# Patient Record
Sex: Female | Born: 1985 | Smoking: Never smoker
Health system: Southern US, Community
[De-identification: ages and names within clinical notes are randomized; demographics above are authoritative.]

## PROBLEM LIST (undated history)

## (undated) DIAGNOSIS — F419 Anxiety disorder, unspecified: Secondary | ICD-10-CM

## (undated) DIAGNOSIS — J45909 Unspecified asthma, uncomplicated: Secondary | ICD-10-CM

## (undated) HISTORY — PX: WISDOM TOOTH EXTRACTION: SHX21

---

## 2014-10-17 DIAGNOSIS — J45909 Unspecified asthma, uncomplicated: Secondary | ICD-10-CM | POA: Insufficient documentation

## 2016-12-17 DIAGNOSIS — F419 Anxiety disorder, unspecified: Secondary | ICD-10-CM | POA: Insufficient documentation

## 2016-12-17 DIAGNOSIS — Z0000 Encounter for general adult medical examination without abnormal findings: Secondary | ICD-10-CM | POA: Insufficient documentation

## 2018-08-31 LAB — OB RESULTS CONSOLE ANTIBODY SCREEN: Antibody Screen: NEGATIVE

## 2018-08-31 LAB — OB RESULTS CONSOLE HEPATITIS B SURFACE ANTIGEN: Hepatitis B Surface Ag: NEGATIVE

## 2018-08-31 LAB — OB RESULTS CONSOLE RUBELLA ANTIBODY, IGM: Rubella: IMMUNE

## 2018-08-31 LAB — OB RESULTS CONSOLE HIV ANTIBODY (ROUTINE TESTING): HIV: NONREACTIVE

## 2018-08-31 LAB — OB RESULTS CONSOLE ABO/RH: RH Type: POSITIVE

## 2018-08-31 LAB — OB RESULTS CONSOLE RPR: RPR: NONREACTIVE

## 2018-09-28 ENCOUNTER — Other Ambulatory Visit: Payer: Self-pay | Admitting: Obstetrics and Gynecology

## 2018-09-28 ENCOUNTER — Other Ambulatory Visit (HOSPITAL_COMMUNITY)
Admission: RE | Admit: 2018-09-28 | Discharge: 2018-09-28 | Disposition: A | Discharge status: Home / Self Care | Payer: Managed Care, Other (non HMO) | Source: Ambulatory Visit | Attending: Obstetrics and Gynecology | Admitting: Obstetrics and Gynecology

## 2018-09-28 DIAGNOSIS — Z3481 Encounter for supervision of other normal pregnancy, first trimester: Secondary | ICD-10-CM | POA: Insufficient documentation

## 2018-10-05 LAB — CYTOLOGY - PAP
Diagnosis: NEGATIVE
HPV: NOT DETECTED

## 2019-02-15 ENCOUNTER — Encounter (HOSPITAL_COMMUNITY): Payer: Self-pay | Admitting: *Deleted

## 2019-02-15 ENCOUNTER — Other Ambulatory Visit: Payer: Self-pay

## 2019-02-15 ENCOUNTER — Inpatient Hospital Stay (HOSPITAL_COMMUNITY)
Admission: AD | Admit: 2019-02-15 | Discharge: 2019-02-15 | Disposition: A | Discharge status: Home / Self Care | Payer: Managed Care, Other (non HMO) | Attending: Obstetrics and Gynecology | Admitting: Obstetrics and Gynecology

## 2019-02-15 DIAGNOSIS — O26893 Other specified pregnancy related conditions, third trimester: Secondary | ICD-10-CM | POA: Insufficient documentation

## 2019-02-15 DIAGNOSIS — M549 Dorsalgia, unspecified: Secondary | ICD-10-CM | POA: Diagnosis not present

## 2019-02-15 DIAGNOSIS — M545 Low back pain: Secondary | ICD-10-CM | POA: Insufficient documentation

## 2019-02-15 DIAGNOSIS — O9989 Other specified diseases and conditions complicating pregnancy, childbirth and the puerperium: Secondary | ICD-10-CM | POA: Diagnosis not present

## 2019-02-15 DIAGNOSIS — Z3A32 32 weeks gestation of pregnancy: Secondary | ICD-10-CM | POA: Diagnosis not present

## 2019-02-15 HISTORY — DX: Unspecified asthma, uncomplicated: J45.909

## 2019-02-15 LAB — URINALYSIS, ROUTINE W REFLEX MICROSCOPIC
Bilirubin Urine: NEGATIVE
Glucose, UA: NEGATIVE mg/dL
Ketones, ur: NEGATIVE mg/dL
Nitrite: NEGATIVE
Protein, ur: NEGATIVE mg/dL
Specific Gravity, Urine: 1.008 (ref 1.005–1.030)
pH: 7 (ref 5.0–8.0)

## 2019-02-15 MED ORDER — ACETAMINOPHEN 500 MG PO TABS
1000.0000 mg | ORAL_TABLET | Freq: Once | ORAL | Status: AC
  Administered 2019-02-15: 1000 mg via ORAL
  Filled 2019-02-15: qty 2

## 2019-02-15 NOTE — Discharge Instructions (Signed)
Back Pain in Pregnancy  Back pain during pregnancy is common. Back pain may be caused by several factors that are related to changes during your pregnancy.  Follow these instructions at home:  Managing pain, stiffness, and swelling          If directed, for sudden (acute) back pain, put ice on the painful area.  ? Put ice in a plastic bag.  ? Place a towel between your skin and the bag.  ? Leave the ice on for 20 minutes, 2-3 times per day.   If directed, apply heat to the affected area before you exercise. Use the heat source that your health care provider recommends, such as a moist heat pack or a heating pad.  ? Place a towel between your skin and the heat source.  ? Leave the heat on for 20-30 minutes.  ? Remove the heat if your skin turns bright red. This is especially important if you are unable to feel pain, heat, or cold. You may have a greater risk of getting burned.   If directed, massage the affected area.  Activity   Exercise as told by your health care provider. Gentle exercise is the best way to prevent or manage back pain.   Listen to your body when lifting. If lifting hurts, ask for help or bend your knees. This uses your leg muscles instead of your back muscles.   Squat down when picking up something from the floor. Do not bend over.   Only use bed rest for short periods as told by your health care provider. Bed rest should only be used for the most severe episodes of back pain.  Standing, sitting, and lying down   Do not stand in one place for long periods of time.   Use good posture when sitting. Make sure your head rests over your shoulders and is not hanging forward. Use a pillow on your lower back if necessary.   Try sleeping on your side, preferably the left side, with a pregnancy support pillow or 1-2 regular pillows between your legs.  ? If you have back pain after a night's rest, your bed may be too soft.  ? A firm mattress may provide more support for your back during  pregnancy.  General instructions   Do not wear high heels.   Eat a healthy diet. Try to gain weight within your health care provider's recommendations.   Use a maternity girdle, elastic sling, or back brace as told by your health care provider.   Take over-the-counter and prescription medicines only as told by your health care provider.   Work with a physical therapist or massage therapist to find ways to manage back pain. Acupuncture or massage therapy may be helpful.   Keep all follow-up visits as told by your health care provider. This is important.  Contact a health care provider if:   Your back pain interferes with your daily activities.   You have increasing pain in other parts of your body.  Get help right away if:   You develop numbness, tingling, weakness, or problems with the use of your arms or legs.   You develop severe back pain that is not controlled with medicine.   You have a change in bowel or bladder control.   You develop shortness of breath, dizziness, or you faint.   You develop nausea, vomiting, or sweating.   You have back pain that is a rhythmic, cramping pain similar to labor pains. Labor   pain is usually 1-2 minutes apart, lasts for about 1 minute, and involves a bearing down feeling or pressure in your pelvis.   You have back pain and your water breaks or you have vaginal bleeding.   You have back pain or numbness that travels down your leg.   Your back pain developed after you fell.   You develop pain on one side of your back.   You see blood in your urine.   You develop skin blisters in the area of your back pain.  Summary   Back pain may be caused by several factors that are related to changes during your pregnancy.   Follow instructions as told by your health care provider for managing pain, stiffness, and swelling.   Exercise as told by your health care provider. Gentle exercise is the best way to prevent or manage back pain.   Take over-the-counter and  prescription medicines only as told by your health care provider.   Keep all follow-up visits as told by your health care provider. This is important.  This information is not intended to replace advice given to you by your health care provider. Make sure you discuss any questions you have with your health care provider.  Document Released: 12/08/2005 Document Revised: 02/15/2018 Document Reviewed: 02/15/2018  Elsevier Interactive Patient Education  2019 [employer]

## 2019-02-15 NOTE — MAU Note (Signed)
Pt reports she has had lower back apin and leg pain and tightening of her tummy. Braxton hicks 3 times an hour. Good fetal movement. Took tylenol for the back pain helped a little. Went to [employer] and tightening and lower back pain started up again

## 2019-02-15 NOTE — MAU Provider Note (Signed)
History     CSN: [id number]  Arrival date and time: 02/15/19 1617   First Provider Initiated Contact with Patient 02/15/19 1717      Chief Complaint  Patient presents with  . Back Pain   HPI Ms. Bing NeighborsMelanie Klindt is a 33 y.o. G2P0010 at 5066w1d who presents to MAU today with complaint of low back pain since yesterday and few BH contractions. The patient states that she took Tylenol this morning around 1100 and it helped some. She has no pain while resting, but 8/10 pain noted with ambulation. She does not have an abdominal support band. She also noted abdominal "tightening" 3 times yesterday and twice today. She denies any tightening while at rest. She denies vaginal bleeding, LOF, UTI symptoms, N/V, fever, SOB or cough. She reports normal fetal movement.   OB History    Gravida  2   Para  0   Term  0   Preterm  0   AB  1   Living  0     SAB  0   TAB  1   Ectopic  0   Multiple  0   Live Births  0           Past Medical History:  Diagnosis Date  . Asthma     Past Surgical History:  Procedure Laterality Date  . NO PAST SURGERIES      No family history on file.  Social History   Tobacco Use  . Smoking status: Never Smoker  Substance Use Topics  . Alcohol use: Not Currently  . Drug use: Never    Allergies: No Known Allergies  No medications prior to admission.    Review of Systems  Constitutional: Negative for fever.  HENT: Negative for congestion.   Respiratory: Negative for cough and shortness of breath.   Gastrointestinal: Positive for abdominal pain. Negative for constipation, diarrhea, nausea and vomiting.  Genitourinary: Positive for vaginal discharge. Negative for dysuria, frequency, urgency and vaginal bleeding.  Musculoskeletal: Positive for back pain.   Physical Exam   Blood pressure 117/64, pulse 83, temperature 98.3 F (36.8 C), resp. rate 18.  Physical Exam  Nursing note and vitals reviewed. Constitutional: She is oriented to  person, place, and time. She appears well-developed and well-nourished. No distress.  HENT:  Head: Normocephalic and atraumatic.  Cardiovascular: Normal rate.  Respiratory: Effort normal.  GI: Soft. She exhibits no distension and no mass. There is no abdominal tenderness. There is no rebound, no guarding and no CVA tenderness.  Musculoskeletal:     Lumbar back: She exhibits tenderness (mild diffuse). She exhibits normal range of motion, no swelling and no spasm.  Neurological: She is alert and oriented to person, place, and time.  Skin: Skin is warm and dry. No erythema.  Psychiatric: She has a normal mood and affect.     Results for orders placed or performed during the hospital encounter of 02/15/19 (from the past 24 hour(s))  Urinalysis, Routine w reflex microscopic     Status: Abnormal   Collection Time: 02/15/19  5:11 PM  Result Value Ref Range   Color, Urine YELLOW YELLOW   APPearance HAZY (A) CLEAR   Specific Gravity, Urine 1.008 1.005 - 1.030   pH 7.0 5.0 - 8.0   Glucose, UA NEGATIVE NEGATIVE mg/dL   Hgb urine dipstick SMALL (A) NEGATIVE   Bilirubin Urine NEGATIVE NEGATIVE   Ketones, ur NEGATIVE NEGATIVE mg/dL   Protein, ur NEGATIVE NEGATIVE mg/dL   Nitrite NEGATIVE  NEGATIVE   Leukocytes,Ua SMALL (A) NEGATIVE   RBC / HPF 6-10 0 - 5 RBC/hpf   WBC, UA 6-10 0 - 5 WBC/hpf   Bacteria, UA RARE (A) NONE SEEN   Squamous Epithelial / LPF 11-20 0 - 5    Fetal Monitoring: Baseline: 140 bpm Variability: moderate Accelerations: 10 x 10 Decelerations: none Contractions: none  MAU Course  Procedures None  MDM UA today without evidence of dehydration or infection 1 G Tylenol given in MAU  NST reassuring for GA and without contractions. No palpable contractions while patient is in MAU.   Assessment and Plan  A: SIUP at [redacted]w[redacted]d Back pain in pregnancy, third trimester   P: Discharge home Tylenol PRN for pain recommended Hydrotherapy and use of heat/ice and abdominal  binder discussed  Preterm labor precautions discussed Patient advised to follow-up with [facility] as scheduled for routine prenatal care or sooner PRN Patient may return to MAU as needed or if her condition were to change or worsen   Vonzella Nipple, PA-C 02/15/2019, 6:36 PM

## 2019-03-19 LAB — OB RESULTS CONSOLE GBS: GBS: POSITIVE

## 2019-04-03 NOTE — H&P (Deleted)
  The note originally documented on this encounter has been moved the the encounter in which it belongs.  

## 2019-04-03 NOTE — H&P (Signed)
Connie Morales is a 33 y.o. female presenting for  External version attempt due to breech presenataion.  Pt will be 39 0/7 weeks on admission. Pt reports contractions 1 per hour.  Good fetal movement. PNC with [facility] complicated. Lower back pain and above. Pt is also Varicella Non Immune and GBS+.  OB History    Gravida  2   Para  0   Term  0   Preterm  0   AB  1   Living  0     SAB  0   TAB  1   Ectopic  0   Multiple  0   Live Births  0          Past Medical History:  Diagnosis Date  . Asthma    Past Surgical History:  Procedure Laterality Date  . NO PAST SURGERIES     Family History: family history is not on file. Social History:  reports that she has never smoked. She does not have any smokeless tobacco history on file. She reports previous alcohol use. She reports that she does not use drugs.     Maternal Diabetes: No Genetic Screening: Normal Maternal Ultrasounds/Referrals: Normal Fetal Ultrasounds or other Referrals:  None Maternal Substance Abuse:  No Significant Maternal Medications:  None Significant Maternal Lab Results:  Group B Strep positive Other Comments:  Breech presentation  ROS Maternal Medical History:  Contractions: Perceived severity is moderate.    Fetal activity: Perceived fetal activity is normal.    Prenatal Complications - Diabetes: none.      There were no vitals taken for this visit. Maternal Exam:  Abdomen: Patient reports no abdominal tenderness. Estimated fetal weight is 03/28/19- 6 pound 1 oz +/-8 oz, .   Fetal presentation: breech  Introitus: Normal vulva. Pelvis: adequate for delivery.   Cervix: 1/60/-2,-3  Fetal Exam Fetal Monitor Review: Mode: hand-held doppler probe.   Baseline rate: 150.      Physical Exam  Constitutional: She is oriented to person, place, and time. She appears well-developed and well-nourished.  HENT:  Head: Normocephalic and atraumatic.  Eyes: EOM are normal.  Neck: Normal  range of motion.  Respiratory: Effort normal and breath sounds normal. No respiratory distress.  GI: There is no abdominal tenderness.  Genitourinary:    Vulva normal.   Musculoskeletal: Normal range of motion.        General: No tenderness.  Neurological: She is alert and oriented to person, place, and time.  Skin: Skin is warm and dry.  Psychiatric: She has a normal mood and affect.    Prenatal labs: ABO, Rh:   Antibody:   Rubella:   RPR:    HBsAg:    HIV:    GBS:     Assessment/Plan: IUP @ 39 0/7 weeks on admission. Breech presentation for external version.  If successful, proceed with IOL.  If unsuccessful, primary LTCS. SGA. GBS+ Varicella Non immune.   Thurnell Lose 04/03/2019, 9:16 PM

## 2019-04-04 ENCOUNTER — Encounter (HOSPITAL_COMMUNITY): Payer: Self-pay

## 2019-04-04 ENCOUNTER — Inpatient Hospital Stay (HOSPITAL_COMMUNITY): Payer: Managed Care, Other (non HMO) | Admitting: Anesthesiology

## 2019-04-04 ENCOUNTER — Inpatient Hospital Stay (HOSPITAL_COMMUNITY)
Admission: RE | Admit: 2019-04-04 | Payer: Managed Care, Other (non HMO) | Source: Home / Self Care | Admitting: Obstetrics and Gynecology

## 2019-04-04 ENCOUNTER — Inpatient Hospital Stay (HOSPITAL_COMMUNITY): Payer: Managed Care, Other (non HMO)

## 2019-04-04 ENCOUNTER — Encounter (HOSPITAL_COMMUNITY)
Admission: AD | Disposition: A | Discharge status: Home / Self Care | Payer: Self-pay | Source: Ambulatory Visit | Attending: Obstetrics and Gynecology

## 2019-04-04 ENCOUNTER — Other Ambulatory Visit: Payer: Self-pay

## 2019-04-04 ENCOUNTER — Inpatient Hospital Stay (HOSPITAL_COMMUNITY)
Admission: AD | Admit: 2019-04-04 | Discharge: 2019-04-07 | DRG: 788 | Disposition: A | Discharge status: Home / Self Care | Payer: Managed Care, Other (non HMO) | Attending: Obstetrics and Gynecology | Admitting: Obstetrics and Gynecology

## 2019-04-04 DIAGNOSIS — O365930 Maternal care for other known or suspected poor fetal growth, third trimester, not applicable or unspecified: Secondary | ICD-10-CM | POA: Diagnosis present

## 2019-04-04 DIAGNOSIS — Z98891 History of uterine scar from previous surgery: Secondary | ICD-10-CM

## 2019-04-04 DIAGNOSIS — Z3A39 39 weeks gestation of pregnancy: Secondary | ICD-10-CM | POA: Diagnosis not present

## 2019-04-04 DIAGNOSIS — Z1159 Encounter for screening for other viral diseases: Secondary | ICD-10-CM | POA: Diagnosis not present

## 2019-04-04 DIAGNOSIS — F419 Anxiety disorder, unspecified: Secondary | ICD-10-CM | POA: Diagnosis present

## 2019-04-04 DIAGNOSIS — O321XX0 Maternal care for breech presentation, not applicable or unspecified: Secondary | ICD-10-CM | POA: Diagnosis present

## 2019-04-04 DIAGNOSIS — O99344 Other mental disorders complicating childbirth: Secondary | ICD-10-CM | POA: Diagnosis present

## 2019-04-04 DIAGNOSIS — O99824 Streptococcus B carrier state complicating childbirth: Secondary | ICD-10-CM | POA: Diagnosis present

## 2019-04-04 DIAGNOSIS — O328XX0 Maternal care for other malpresentation of fetus, not applicable or unspecified: Principal | ICD-10-CM | POA: Diagnosis present

## 2019-04-04 HISTORY — DX: Anxiety disorder, unspecified: F41.9

## 2019-04-04 LAB — CBC
HCT: 37.7 % (ref 36.0–46.0)
Hemoglobin: 12.3 g/dL (ref 12.0–15.0)
MCH: 27.6 pg (ref 26.0–34.0)
MCHC: 32.6 g/dL (ref 30.0–36.0)
MCV: 84.7 fL (ref 80.0–100.0)
Platelets: 282 10*3/uL (ref 150–400)
RBC: 4.45 MIL/uL (ref 3.87–5.11)
RDW: 14.2 % (ref 11.5–15.5)
WBC: 10.1 10*3/uL (ref 4.0–10.5)
nRBC: 0 % (ref 0.0–0.2)

## 2019-04-04 LAB — TYPE AND SCREEN
ABO/RH(D): O POS
Antibody Screen: NEGATIVE

## 2019-04-04 LAB — SARS CORONAVIRUS 2 BY RT PCR (HOSPITAL ORDER, PERFORMED IN ~~LOC~~ HOSPITAL LAB): SARS Coronavirus 2: NEGATIVE

## 2019-04-04 SURGERY — Surgical Case
Anesthesia: Spinal

## 2019-04-04 MED ORDER — TERBUTALINE SULFATE 1 MG/ML IJ SOLN
0.2500 mg | Freq: Once | INTRAMUSCULAR | Status: AC
  Administered 2019-04-04: 0.25 mg via SUBCUTANEOUS

## 2019-04-04 MED ORDER — FLEET ENEMA 7-19 GM/118ML RE ENEM: 1.0000 | ENEMA | RECTAL | Status: DC | PRN

## 2019-04-04 MED ORDER — PROMETHAZINE HCL 25 MG/ML IJ SOLN
INTRAMUSCULAR | Status: AC
  Filled 2019-04-04: qty 1

## 2019-04-04 MED ORDER — DIPHENHYDRAMINE HCL 50 MG/ML IJ SOLN: 12.5000 mg | Freq: Four times a day (QID) | INTRAMUSCULAR | Status: DC | PRN

## 2019-04-04 MED ORDER — ACETAMINOPHEN 325 MG PO TABS: 650.0000 mg | ORAL_TABLET | ORAL | Status: DC | PRN

## 2019-04-04 MED ORDER — TETANUS-DIPHTH-ACELL PERTUSSIS 5-2.5-18.5 LF-MCG/0.5 IM SUSP: 0.5000 mL | Freq: Once | INTRAMUSCULAR | Status: DC

## 2019-04-04 MED ORDER — LACTATED RINGERS IV SOLN: INTRAVENOUS | Status: DC

## 2019-04-04 MED ORDER — METHYLERGONOVINE MALEATE 0.2 MG PO TABS: 0.2000 mg | ORAL_TABLET | ORAL | Status: DC | PRN

## 2019-04-04 MED ORDER — NALOXONE HCL 0.4 MG/ML IJ SOLN: 0.4000 mg | INTRAMUSCULAR | Status: DC | PRN

## 2019-04-04 MED ORDER — SIMETHICONE 80 MG PO CHEW: 80.0000 mg | CHEWABLE_TABLET | ORAL | Status: DC | PRN

## 2019-04-04 MED ORDER — FAMOTIDINE 20 MG PO TABS
20.0000 mg | ORAL_TABLET | Freq: Once | ORAL | Status: AC
  Administered 2019-04-04: 12:00:00 20 mg via ORAL

## 2019-04-04 MED ORDER — ONDANSETRON HCL 4 MG/2ML IJ SOLN: 4.0000 mg | Freq: Four times a day (QID) | INTRAMUSCULAR | Status: DC | PRN

## 2019-04-04 MED ORDER — CEFAZOLIN SODIUM-DEXTROSE 2-4 GM/100ML-% IV SOLN
2.0000 g | INTRAVENOUS | Status: AC
  Administered 2019-04-04: 2 g via INTRAVENOUS

## 2019-04-04 MED ORDER — SODIUM CHLORIDE 0.9% FLUSH: 3.0000 mL | INTRAVENOUS | Status: DC | PRN

## 2019-04-04 MED ORDER — FAMOTIDINE 20 MG PO TABS
ORAL_TABLET | ORAL | Status: AC
  Filled 2019-04-04: qty 1

## 2019-04-04 MED ORDER — ONDANSETRON HCL 4 MG/2ML IJ SOLN
4.0000 mg | Freq: Three times a day (TID) | INTRAMUSCULAR | Status: DC | PRN
  Administered 2019-04-04: 4 mg via INTRAVENOUS
  Filled 2019-04-04 (×2): qty 2

## 2019-04-04 MED ORDER — PROMETHAZINE HCL 25 MG/ML IJ SOLN: 12.5000 mg | Freq: Four times a day (QID) | INTRAMUSCULAR | Status: DC | PRN

## 2019-04-04 MED ORDER — SIMETHICONE 80 MG PO CHEW
80.0000 mg | CHEWABLE_TABLET | Freq: Three times a day (TID) | ORAL | Status: DC
  Administered 2019-04-05 – 2019-04-07 (×8): 80 mg via ORAL
  Filled 2019-04-04 (×8): qty 1

## 2019-04-04 MED ORDER — OXYCODONE-ACETAMINOPHEN 5-325 MG PO TABS: 1.0000 | ORAL_TABLET | ORAL | Status: DC | PRN

## 2019-04-04 MED ORDER — HYDROXYZINE HCL 50 MG PO TABS
50.0000 mg | ORAL_TABLET | Freq: Four times a day (QID) | ORAL | Status: DC | PRN
  Filled 2019-04-04: qty 1

## 2019-04-04 MED ORDER — DIPHENHYDRAMINE HCL 25 MG PO CAPS
25.0000 mg | ORAL_CAPSULE | Freq: Four times a day (QID) | ORAL | Status: DC | PRN
  Administered 2019-04-05: 25 mg via ORAL
  Filled 2019-04-04: qty 1

## 2019-04-04 MED ORDER — SODIUM CHLORIDE 0.9 % IV SOLN
INTRAVENOUS | Status: DC | PRN
  Administered 2019-04-04: 40 [IU] via INTRAVENOUS

## 2019-04-04 MED ORDER — SOD CITRATE-CITRIC ACID 500-334 MG/5ML PO SOLN
ORAL | Status: AC
  Filled 2019-04-04: qty 15

## 2019-04-04 MED ORDER — NALBUPHINE HCL 10 MG/ML IJ SOLN: 5.0000 mg | INTRAMUSCULAR | Status: DC | PRN

## 2019-04-04 MED ORDER — PRENATAL MULTIVITAMIN CH
1.0000 | ORAL_TABLET | Freq: Every day | ORAL | Status: DC
  Administered 2019-04-05 – 2019-04-07 (×3): 1 via ORAL
  Filled 2019-04-04 (×3): qty 1

## 2019-04-04 MED ORDER — HYDROMORPHONE HCL 1 MG/ML IJ SOLN
INTRAMUSCULAR | Status: AC
  Filled 2019-04-04: qty 1

## 2019-04-04 MED ORDER — SODIUM CHLORIDE 0.9 % IV SOLN
INTRAVENOUS | Status: DC | PRN
  Administered 2019-04-04: 12:00:00 via INTRAVENOUS

## 2019-04-04 MED ORDER — ENOXAPARIN SODIUM 40 MG/0.4ML ~~LOC~~ SOLN
40.0000 mg | SUBCUTANEOUS | Status: DC
  Administered 2019-04-05 – 2019-04-07 (×3): 40 mg via SUBCUTANEOUS
  Filled 2019-04-04 (×3): qty 0.4

## 2019-04-04 MED ORDER — HYDROMORPHONE HCL 1 MG/ML IJ SOLN
0.2500 mg | INTRAMUSCULAR | Status: DC | PRN
  Administered 2019-04-04 (×2): 0.5 mg via INTRAVENOUS

## 2019-04-04 MED ORDER — SODIUM CHLORIDE 0.9 % IR SOLN
Status: DC | PRN
  Administered 2019-04-04: 1

## 2019-04-04 MED ORDER — OXYTOCIN 40 UNITS IN NORMAL SALINE INFUSION - SIMPLE MED: 2.5000 [IU]/h | INTRAVENOUS | Status: AC

## 2019-04-04 MED ORDER — WITCH HAZEL-GLYCERIN EX PADS: 1.0000 "application " | MEDICATED_PAD | CUTANEOUS | Status: DC | PRN

## 2019-04-04 MED ORDER — ACETAMINOPHEN 500 MG PO TABS
1000.0000 mg | ORAL_TABLET | Freq: Four times a day (QID) | ORAL | Status: DC
  Administered 2019-04-04 – 2019-04-07 (×11): 1000 mg via ORAL
  Filled 2019-04-04 (×12): qty 2

## 2019-04-04 MED ORDER — BUTORPHANOL TARTRATE 1 MG/ML IJ SOLN: 1.0000 mg | INTRAMUSCULAR | Status: DC | PRN

## 2019-04-04 MED ORDER — OXYTOCIN BOLUS FROM INFUSION: 500.0000 mL | Freq: Once | INTRAVENOUS | Status: DC

## 2019-04-04 MED ORDER — SIMETHICONE 80 MG PO CHEW
80.0000 mg | CHEWABLE_TABLET | ORAL | Status: DC
  Administered 2019-04-04 – 2019-04-07 (×3): 80 mg via ORAL
  Filled 2019-04-04 (×3): qty 1

## 2019-04-04 MED ORDER — DIPHENHYDRAMINE HCL 25 MG PO CAPS: 25.0000 mg | ORAL_CAPSULE | ORAL | Status: DC | PRN

## 2019-04-04 MED ORDER — PROMETHAZINE HCL 25 MG/ML IJ SOLN: 6.2500 mg | INTRAMUSCULAR | Status: DC | PRN

## 2019-04-04 MED ORDER — MENTHOL 3 MG MT LOZG: 1.0000 | LOZENGE | OROMUCOSAL | Status: DC | PRN

## 2019-04-04 MED ORDER — SENNOSIDES-DOCUSATE SODIUM 8.6-50 MG PO TABS
2.0000 | ORAL_TABLET | ORAL | Status: DC
  Administered 2019-04-04 – 2019-04-07 (×3): 2 via ORAL
  Filled 2019-04-04 (×4): qty 2

## 2019-04-04 MED ORDER — FAMOTIDINE 20 MG PO TABS
20.0000 mg | ORAL_TABLET | Freq: Two times a day (BID) | ORAL | Status: DC | PRN
  Administered 2019-04-05 – 2019-04-06 (×2): 20 mg via ORAL
  Filled 2019-04-04 (×2): qty 1

## 2019-04-04 MED ORDER — LACTATED RINGERS IV SOLN: 500.0000 mL | INTRAVENOUS | Status: DC | PRN

## 2019-04-04 MED ORDER — SCOPOLAMINE 1 MG/3DAYS TD PT72
1.0000 | MEDICATED_PATCH | Freq: Once | TRANSDERMAL | Status: AC
  Administered 2019-04-04: 17:00:00 1.5 mg via TRANSDERMAL
  Filled 2019-04-04: qty 1

## 2019-04-04 MED ORDER — KETOROLAC TROMETHAMINE 30 MG/ML IJ SOLN: 30.0000 mg | Freq: Once | INTRAMUSCULAR | Status: DC

## 2019-04-04 MED ORDER — ZOLPIDEM TARTRATE 5 MG PO TABS: 5.0000 mg | ORAL_TABLET | Freq: Every evening | ORAL | Status: DC | PRN

## 2019-04-04 MED ORDER — OXYCODONE HCL 5 MG PO TABS: 5.0000 mg | ORAL_TABLET | Freq: Once | ORAL | Status: DC | PRN

## 2019-04-04 MED ORDER — LACTATED RINGERS IV SOLN
INTRAVENOUS | Status: DC
  Administered 2019-04-04: 23:00:00 via INTRAVENOUS

## 2019-04-04 MED ORDER — NALBUPHINE HCL 10 MG/ML IJ SOLN: 5.0000 mg | Freq: Once | INTRAMUSCULAR | Status: DC | PRN

## 2019-04-04 MED ORDER — LACTATED RINGERS IV BOLUS
500.0000 mL | Freq: Once | INTRAVENOUS | Status: AC
  Administered 2019-04-04: 500 mL via INTRAVENOUS

## 2019-04-04 MED ORDER — FENTANYL CITRATE (PF) 100 MCG/2ML IJ SOLN
INTRAMUSCULAR | Status: DC | PRN
  Administered 2019-04-04: 15 ug via INTRATHECAL

## 2019-04-04 MED ORDER — CEFAZOLIN SODIUM-DEXTROSE 2-4 GM/100ML-% IV SOLN
INTRAVENOUS | Status: AC
  Filled 2019-04-04: qty 100

## 2019-04-04 MED ORDER — ONDANSETRON HCL 4 MG/2ML IJ SOLN
INTRAMUSCULAR | Status: DC | PRN
  Administered 2019-04-04: 4 mg via INTRAVENOUS

## 2019-04-04 MED ORDER — SOD CITRATE-CITRIC ACID 500-334 MG/5ML PO SOLN
30.0000 mL | ORAL | Status: DC | PRN
  Administered 2019-04-04: 12:00:00 30 mL via ORAL

## 2019-04-04 MED ORDER — SOD CITRATE-CITRIC ACID 500-334 MG/5ML PO SOLN: 30.0000 mL | Freq: Once | ORAL | Status: DC

## 2019-04-04 MED ORDER — NALOXONE HCL 4 MG/10ML IJ SOLN
1.0000 ug/kg/h | INTRAVENOUS | Status: DC | PRN
  Filled 2019-04-04: qty 5

## 2019-04-04 MED ORDER — BUPIVACAINE IN DEXTROSE 0.75-8.25 % IT SOLN
INTRATHECAL | Status: DC | PRN
  Administered 2019-04-04: 1.6 mL via INTRATHECAL

## 2019-04-04 MED ORDER — OXYCODONE HCL 5 MG PO TABS
5.0000 mg | ORAL_TABLET | ORAL | Status: DC | PRN
  Administered 2019-04-05 – 2019-04-06 (×7): 10 mg via ORAL
  Administered 2019-04-07: 5 mg via ORAL
  Administered 2019-04-07: 13:00:00 10 mg via ORAL
  Filled 2019-04-04 (×6): qty 2
  Filled 2019-04-04: qty 1
  Filled 2019-04-04 (×2): qty 2

## 2019-04-04 MED ORDER — OXYTOCIN 40 UNITS IN NORMAL SALINE INFUSION - SIMPLE MED: 2.5000 [IU]/h | INTRAVENOUS | Status: DC

## 2019-04-04 MED ORDER — MORPHINE SULFATE (PF) 0.5 MG/ML IJ SOLN
INTRAMUSCULAR | Status: DC | PRN
  Administered 2019-04-04: .15 mg via INTRATHECAL

## 2019-04-04 MED ORDER — IBUPROFEN 600 MG PO TABS
600.0000 mg | ORAL_TABLET | Freq: Four times a day (QID) | ORAL | Status: DC | PRN
  Administered 2019-04-05 – 2019-04-07 (×9): 600 mg via ORAL
  Filled 2019-04-04 (×9): qty 1

## 2019-04-04 MED ORDER — METHYLERGONOVINE MALEATE 0.2 MG/ML IJ SOLN: 0.2000 mg | INTRAMUSCULAR | Status: DC | PRN

## 2019-04-04 MED ORDER — TERBUTALINE SULFATE 1 MG/ML IJ SOLN
INTRAMUSCULAR | Status: AC
  Filled 2019-04-04: qty 1

## 2019-04-04 MED ORDER — LACTATED RINGERS IV BOLUS
1000.0000 mL | Freq: Once | INTRAVENOUS | Status: AC
  Administered 2019-04-04: 1000 mL via INTRAVENOUS

## 2019-04-04 MED ORDER — ALBUTEROL SULFATE (2.5 MG/3ML) 0.083% IN NEBU: 2.5000 mg | INHALATION_SOLUTION | Freq: Four times a day (QID) | RESPIRATORY_TRACT | Status: DC | PRN

## 2019-04-04 MED ORDER — COCONUT OIL OIL: 1.0000 "application " | TOPICAL_OIL | Status: DC | PRN

## 2019-04-04 MED ORDER — SODIUM CHLORIDE 0.9 % IV SOLN
INTRAVENOUS | Status: DC | PRN
  Administered 2019-04-04: 60 ug/min via INTRAVENOUS

## 2019-04-04 MED ORDER — LACTATED RINGERS IV SOLN
INTRAVENOUS | Status: DC
  Administered 2019-04-04: 10:00:00 via INTRAVENOUS

## 2019-04-04 MED ORDER — KETOROLAC TROMETHAMINE 30 MG/ML IJ SOLN
INTRAMUSCULAR | Status: AC
  Filled 2019-04-04: qty 1

## 2019-04-04 MED ORDER — KETOROLAC TROMETHAMINE 30 MG/ML IJ SOLN
30.0000 mg | Freq: Four times a day (QID) | INTRAMUSCULAR | Status: DC | PRN
  Administered 2019-04-04: 30 mg via INTRAVENOUS
  Filled 2019-04-04: qty 1

## 2019-04-04 MED ORDER — DIBUCAINE (PERIANAL) 1 % EX OINT: 1.0000 "application " | TOPICAL_OINTMENT | CUTANEOUS | Status: DC | PRN

## 2019-04-04 MED ORDER — LIDOCAINE HCL (PF) 1 % IJ SOLN
30.0000 mL | INTRAMUSCULAR | Status: DC | PRN
  Filled 2019-04-04: qty 30

## 2019-04-04 MED ORDER — FAMOTIDINE IN NACL 20-0.9 MG/50ML-% IV SOLN
20.0000 mg | Freq: Once | INTRAVENOUS | Status: DC
  Filled 2019-04-04: qty 50

## 2019-04-04 MED ORDER — OXYCODONE-ACETAMINOPHEN 5-325 MG PO TABS: 2.0000 | ORAL_TABLET | ORAL | Status: DC | PRN

## 2019-04-04 MED ORDER — OXYCODONE HCL 5 MG/5ML PO SOLN: 5.0000 mg | Freq: Once | ORAL | Status: DC | PRN

## 2019-04-04 SURGICAL SUPPLY — 36 items
BARRIER ADHS 3X4 INTERCEED (GAUZE/BANDAGES/DRESSINGS) ×3 IMPLANT
BENZOIN TINCTURE PRP APPL 2/3 (GAUZE/BANDAGES/DRESSINGS) ×2 IMPLANT
CHLORAPREP W/TINT 26ML (MISCELLANEOUS) ×3 IMPLANT
CLAMP CORD UMBIL (MISCELLANEOUS) IMPLANT
CLOSURE WOUND 1/2 X4 (GAUZE/BANDAGES/DRESSINGS) ×1
CLOTH BEACON ORANGE TIMEOUT ST (SAFETY) ×3 IMPLANT
DERMABOND ADVANCED (GAUZE/BANDAGES/DRESSINGS)
DERMABOND ADVANCED .7 DNX12 (GAUZE/BANDAGES/DRESSINGS) IMPLANT
DRSG OPSITE POSTOP 4X10 (GAUZE/BANDAGES/DRESSINGS) ×3 IMPLANT
ELECT REM PT RETURN 9FT ADLT (ELECTROSURGICAL) ×3
ELECTRODE REM PT RTRN 9FT ADLT (ELECTROSURGICAL) ×1 IMPLANT
EXTRACTOR VACUUM BELL STYLE (SUCTIONS) IMPLANT
GLOVE BIO SURGEON STRL SZ7 (GLOVE) ×3 IMPLANT
GLOVE BIOGEL PI IND STRL 7.0 (GLOVE) ×2 IMPLANT
GLOVE BIOGEL PI INDICATOR 7.0 (GLOVE) ×4
GOWN STRL REUS W/TWL LRG LVL3 (GOWN DISPOSABLE) ×6 IMPLANT
KIT ABG SYR 3ML LUER SLIP (SYRINGE) IMPLANT
NDL HYPO 25X5/8 SAFETYGLIDE (NEEDLE) IMPLANT
NEEDLE HYPO 25X5/8 SAFETYGLIDE (NEEDLE) IMPLANT
NS IRRIG 1000ML POUR BTL (IV SOLUTION) ×3 IMPLANT
PACK C SECTION WH (CUSTOM PROCEDURE TRAY) ×3 IMPLANT
PAD OB MATERNITY 4.3X12.25 (PERSONAL CARE ITEMS) ×3 IMPLANT
PENCIL SMOKE EVAC W/HOLSTER (ELECTROSURGICAL) ×3 IMPLANT
RTRCTR C-SECT PINK 25CM LRG (MISCELLANEOUS) ×3 IMPLANT
STRIP CLOSURE SKIN 1/2X4 (GAUZE/BANDAGES/DRESSINGS) ×1 IMPLANT
SUT CHROMIC 0 CTX 36 (SUTURE) IMPLANT
SUT MON AB 4-0 PS1 27 (SUTURE) ×3 IMPLANT
SUT PLAIN 0 NONE (SUTURE) IMPLANT
SUT PLAIN 2 0 XLH (SUTURE) ×3 IMPLANT
SUT VIC AB 0 CTX 36 (SUTURE) ×10
SUT VIC AB 0 CTX36XBRD ANBCTRL (SUTURE) ×5 IMPLANT
SUT VIC AB 2-0 CT1 27 (SUTURE) ×2
SUT VIC AB 2-0 CT1 TAPERPNT 27 (SUTURE) ×1 IMPLANT
TOWEL OR 17X24 6PK STRL BLUE (TOWEL DISPOSABLE) ×3 IMPLANT
TRAY FOLEY W/BAG SLVR 14FR LF (SET/KITS/TRAYS/PACK) IMPLANT
WATER STERILE IRR 1000ML POUR (IV SOLUTION) ×3 IMPLANT

## 2019-04-04 NOTE — Anesthesia Procedure Notes (Signed)
Spinal  Patient location during procedure: OR Start time: 04/04/2019 11:40 AM End time: 04/04/2019 11:50 AM Staffing Anesthesiologist: Murvin Natal, MD Performed: anesthesiologist  Preanesthetic Checklist Completed: patient identified, surgical consent, pre-op evaluation, timeout performed, IV checked, risks and benefits discussed and monitors and equipment checked Spinal Block Patient position: sitting Prep: DuraPrep Patient monitoring: cardiac monitor, continuous pulse ox and blood pressure Approach: midline Location: L4-5 Injection technique: single-shot Needle Needle type: Pencan  Needle gauge: 24 G Needle length: 9 cm Assessment Sensory level: T10 Additional Notes Functioning IV was confirmed and monitors were applied. Sterile prep and drape, including hand hygiene and sterile gloves were used. The patient was positioned and the spine was prepped. The skin was anesthetized with lidocaine.  Free flow of clear CSF was obtained prior to injecting local anesthetic into the CSF.  The spinal needle aspirated freely following injection.  The needle was carefully withdrawn.  The patient tolerated the procedure well.

## 2019-04-04 NOTE — MAU Note (Signed)
Swab collected without difficulty. 

## 2019-04-04 NOTE — Brief Op Note (Signed)
04/04/2019  12:52 PM  PATIENT:  Connie Morales  33 y.o. female  PRE-OPERATIVE DIAGNOSIS:  IUP @ 39 0/7 weeks, O32.1XX0 Breech presentation, single fetus, failed external version  POST-OPERATIVE DIAGNOSIS:    PROCEDURE:  Procedure(s): CESAREAN SECTION (N/A), Primary LTCS  SURGEON:  Surgeon(s) and Role:    Thurnell Lose, MD - Primary  PHYSICIAN ASSISTANT:   ASSISTANTS: Technician   ANESTHESIA:   spinal  EBL:  99 mL   BLOOD ADMINISTERED:none  DRAINS: Urinary Catheter (Foley)   LOCAL MEDICATIONS USED:  NONE  SPECIMEN:  No Specimen  DISPOSITION OF SPECIMEN:  N/A  COUNTS:  YES  TOURNIQUET:  * No tourniquets in log *  DICTATION: .Other Dictation: Dictation Number [id number]  PLAN OF CARE: Admit to inpatient   PATIENT DISPOSITION:  PACU - hemodynamically stable.   Delay start of Pharmacological VTE agent (>24hrs) due to surgical blood loss or risk of bleeding: no

## 2019-04-04 NOTE — Procedures (Signed)
NST reviewed, Category I.  Irregular contractions.  Terbutaline given ~ 20 minutes prior to procedure.  Ultrasound performed.  Remains breech, spine up to maternal right.  Cervical exam 1/50, unchanged.  Presenting part in pelvis.  Reviewed the risk, benefits, and alternatives to the procedure.  Pt desires to proceed.  Consent signed.  Removed breech out of pelvis up to symphysis pubis.  Attempted back somersault.  Head moved from LUQ to RUQ but not more rotation noted with routine methods.  Attempted front somersault and this seemed much easier with less resistant.  Head got to transverse and unable to turn the fetus any further.  Repeat cervical exam confirmed breech was out of the pelvis.  Another attempt was unsuccessful.  Mother appropriately tender.  Stopped several times and pt desired to continue.  FHR checked q 2 minutes, ranged from 120s-150s.  After procedure, mother without distress except emotional.  FHR 130s with min-moderate variability but no decelerations.    Pt consented for primary LTCS.

## 2019-04-04 NOTE — Transfer of Care (Signed)
Immediate Anesthesia Transfer of Care Note  Patient: Connie Morales  Procedure(s) Performed: CESAREAN SECTION (N/A )  Patient Location: PACU  Anesthesia Type:Spinal  Level of Consciousness: awake, alert  and oriented  Airway & Oxygen Therapy: Patient Spontanous Breathing  Post-op Assessment: Report given to RN and Post -op Vital signs reviewed and stable  Post vital signs: Reviewed and stable  Last Vitals:  Vitals Value Taken Time  BP 109/63 04/04/19 1251  Temp 36.1 C 04/04/19 1251  Pulse 75 04/04/19 1255  Resp 20 04/04/19 1255  SpO2 100 % 04/04/19 1255  Vitals shown include unvalidated device data.  Last Pain:  Vitals:   04/04/19 1251  TempSrc: Oral         Complications: No apparent anesthesia complications

## 2019-04-04 NOTE — Progress Notes (Signed)
Subjective: Postpartum Day 1: Cesarean Delivery RN called at [facility] to report decreased urine output, 179ml/4 hours. As well as patient with nausea and vomiting and unable to tolerate PO. Per RN request, IV toradol ordered PRN for pajn until patient tolerating PO medication. Phenergan added PRN as secondary option if Zofran is not controlling nausea and vomiting. 1L bolus ordered for oliguria, RN requested to inform me if output doesn't improve. At 2315, RN called to report only 40ml urine output via foley catheter. I consulted Dr. Mancel Bale regarding the persistent oliguria.   Patient reports no oral intake from midnight before her failed version until night shift after her nausea and vomiting was controlled. She has had 374ml of apple juice and one applesauce PO without difficulty. Discontinuing IV toradol. She denies any cardiac or respiratory symptoms. There are no documented vitals before her surgery but per patient her low pulse and blood pressure are typical for her.   Objective: Vitals:   04/04/19 1552 04/04/19 1700 04/04/19 1830 04/04/19 2238  BP: (!) 105/53 (!) 105/57 107/64 99/68  Pulse: 67 68 60 (!) 56  Resp: 14 16 14 18   Temp: (!) 97.4 F (36.3 C) (!) 97.4 F (36.3 C) (!) 97.3 F (36.3 C) 97.8 F (36.6 C)  TempSrc: Oral Oral Oral Oral  SpO2: 98% 100% 100% 100%  Weight:      Height:        Physical Exam  Constitutional: She is oriented to person, place, and time. She appears well-developed and well-nourished.  HENT:  Head: Normocephalic.  Eyes: Pupils are equal, round, and reactive to light.  Cardiovascular: Normal rate, regular rhythm and normal heart sounds.  Pulmonary/Chest: Effort normal and breath sounds normal. No respiratory distress.  Musculoskeletal: Normal range of motion.        General: No edema.  Neurological: She is alert and oriented to person, place, and time.  Skin: Skin is warm and dry.  Psychiatric: She has a normal mood and affect. Her behavior is normal.  Judgment and thought content normal.   Results for orders placed or performed during the hospital encounter of 04/04/19 (from the past 24 hour(s))  SARS Coronavirus 2 (CEPHEID - Performed in [place] hospital lab), [facility] Order     Status: None   Collection Time: 04/04/19  8:31 AM   Specimen: Nasopharyngeal Swab  Result Value Ref Range   SARS Coronavirus 2 NEGATIVE NEGATIVE  CBC     Status: None   Collection Time: 04/04/19  9:30 AM  Result Value Ref Range   WBC 10.1 4.0 - 10.5 K/uL   RBC 4.45 3.87 - 5.11 MIL/uL   Hemoglobin 12.3 12.0 - 15.0 g/dL   HCT 37.7 36.0 - 46.0 %   MCV 84.7 80.0 - 100.0 fL   MCH 27.6 26.0 - 34.0 pg   MCHC 32.6 30.0 - 36.0 g/dL   RDW 14.2 11.5 - 15.5 %   Platelets 282 150 - 400 K/uL   nRBC 0.0 0.0 - 0.2 %  Type and screen [place]     Status: None   Collection Time: 04/04/19  9:34 AM  Result Value Ref Range   ABO/RH(D) O POS    Antibody Screen NEG    Sample Expiration      04/07/2019,2359 Performed at [facility], [place] [address], [place], [place] [address]   ABO/Rh     Status: None   Collection Time: 04/04/19  9:34 AM  Result Value Ref Range  ABO/RH(D)      O POS Performed at [facility], [address] [place], [place] [address]     Assessment/Plan: 33 y.o. G2P1, day of post-op from primary cesarean for breech Nausea and vomiting resolved, patient tolerating PO fluids and full liquids  S/p 1L fluid bolus for oliguria without improvement Consulted Dr. Su Hiltoberts  -Foley checked for kinks and flushed, flowing freely by gravity to bedside bag  -Additional 500ml fluid bolus  -CBC/CMP  -Foley with urometer Will continue to monitor and follow up with Dr. Su Hiltoberts after bolus given and labs resulted   Connie RiggersEllis K Jensyn Shave 04/04/2019, 11:47 PM

## 2019-04-04 NOTE — Anesthesia Postprocedure Evaluation (Signed)
Anesthesia Post Note  Patient: Connie Morales  Procedure(s) Performed: CESAREAN SECTION (N/A )     Patient location during evaluation: PACU Anesthesia Type: Spinal Level of consciousness: oriented and awake and alert Pain management: pain level controlled Vital Signs Assessment: post-procedure vital signs reviewed and stable Respiratory status: spontaneous breathing, respiratory function stable and patient connected to nasal cannula oxygen Cardiovascular status: blood pressure returned to baseline and stable Postop Assessment: no headache, no backache and no apparent nausea or vomiting Anesthetic complications: no    Last Vitals:  Vitals:   04/04/19 1404 04/04/19 1405  BP:    Pulse: 61 64  Resp: 13 18  Temp:    SpO2: 99% 100%    Last Pain:  Vitals:   04/04/19 1251  TempSrc: Oral   Pain Goal:                Epidural/Spinal Function Cutaneous sensation: No Sensation (04/04/19 1358), Patient able to flex knees: No (04/04/19 1358), Patient able to lift hips off bed: No (04/04/19 1358), Back pain beyond tenderness at insertion site: No (04/04/19 1358), Progressively worsening motor and/or sensory loss: No (04/04/19 1358), Bowel and/or bladder incontinence post epidural: No (04/04/19 1358)  Valdis Bevill P Yanni Quiroa

## 2019-04-04 NOTE — Anesthesia Preprocedure Evaluation (Addendum)
Anesthesia Evaluation  Patient identified by MRN, date of birth, ID band Patient awake    Reviewed: Allergy & Precautions, NPO status , Patient's Chart, lab work & pertinent test results  Airway Mallampati: II  TM Distance: >3 FB Neck ROM: Full    Dental no notable dental hx.    Pulmonary asthma ,    Pulmonary exam normal breath sounds clear to auscultation       Cardiovascular negative cardio ROS Normal cardiovascular exam Rhythm:Regular Rate:Normal     Neuro/Psych Anxiety negative neurological ROS     GI/Hepatic negative GI ROS, Neg liver ROS,   Endo/Other  negative endocrine ROS  Renal/GU negative Renal ROS     Musculoskeletal negative musculoskeletal ROS (+)   Abdominal   Peds  Hematology negative hematology ROS (+)   Anesthesia Other Findings Breech presentation, single fetus  Reproductive/Obstetrics                            Anesthesia Physical Anesthesia Plan  ASA: II  Anesthesia Plan: Spinal   Post-op Pain Management:    Induction: Intravenous  PONV Risk Score and Plan: 2 and Ondansetron, Dexamethasone and Treatment may vary due to age or medical condition  Airway Management Planned: Natural Airway  Additional Equipment:   Intra-op Plan:   Post-operative Plan:   Informed Consent: I have reviewed the patients History and Physical, chart, labs and discussed the procedure including the risks, benefits and alternatives for the proposed anesthesia with the patient or authorized representative who has indicated his/her understanding and acceptance.     Dental advisory given  Plan Discussed with: CRNA  Anesthesia Plan Comments:         Anesthesia Quick Evaluation

## 2019-04-04 NOTE — Progress Notes (Signed)
Per PACU RN pt had IV Toradol at 1441 but this is not charted in [facility].  Pt not tolerating ice/clears but declines Phenergen.  She is upset and states, "I feel so weak and bad.  They didn't listen to me and kept giving me meds after I told them I didn't need anything".  Attempted to contact PACU RN so that the [facility] could be updated, but no answer.   Will continue to monitor.

## 2019-04-05 ENCOUNTER — Encounter (HOSPITAL_COMMUNITY): Payer: Self-pay | Admitting: Obstetrics and Gynecology

## 2019-04-05 LAB — COMPREHENSIVE METABOLIC PANEL
ALT: 22 U/L (ref 0–44)
AST: 33 U/L (ref 15–41)
Albumin: 2 g/dL — ABNORMAL LOW (ref 3.5–5.0)
Alkaline Phosphatase: 123 U/L (ref 38–126)
Anion gap: 8 (ref 5–15)
BUN: 6 mg/dL (ref 6–20)
CO2: 22 mmol/L (ref 22–32)
Calcium: 8.3 mg/dL — ABNORMAL LOW (ref 8.9–10.3)
Chloride: 107 mmol/L (ref 98–111)
Creatinine, Ser: 0.77 mg/dL (ref 0.44–1.00)
GFR calc Af Amer: >60 mL/min (ref >60)
GFR calc non Af Amer: >60 mL/min (ref >60)
Glucose, Bld: 110 mg/dL — ABNORMAL HIGH (ref 70–99)
Potassium: 3.9 mmol/L (ref 3.5–5.1)
Sodium: 137 mmol/L (ref 135–145)
Total Bilirubin: 0.5 mg/dL (ref 0.3–1.2)
Total Protein: 4.9 g/dL — ABNORMAL LOW (ref 6.5–8.1)

## 2019-04-05 LAB — CBC WITH DIFFERENTIAL/PLATELET
Abs Immature Granulocytes: 0.04 10*3/uL (ref 0.00–0.07)
Basophils Absolute: 0 10*3/uL (ref 0.0–0.1)
Basophils Relative: 0 %
Eosinophils Absolute: 0 10*3/uL (ref 0.0–0.5)
Eosinophils Relative: 0 %
HCT: 34.4 % — ABNORMAL LOW (ref 36.0–46.0)
Hemoglobin: 11.1 g/dL — ABNORMAL LOW (ref 12.0–15.0)
Immature Granulocytes: 0 %
Lymphocytes Relative: 18 %
Lymphs Abs: 1.9 10*3/uL (ref 0.7–4.0)
MCH: 27.9 pg (ref 26.0–34.0)
MCHC: 32.3 g/dL (ref 30.0–36.0)
MCV: 86.4 fL (ref 80.0–100.0)
Monocytes Absolute: 0.6 10*3/uL (ref 0.1–1.0)
Monocytes Relative: 6 %
Neutro Abs: 8.1 10*3/uL — ABNORMAL HIGH (ref 1.7–7.7)
Neutrophils Relative %: 76 %
Platelets: 256 10*3/uL (ref 150–400)
RBC: 3.98 MIL/uL (ref 3.87–5.11)
RDW: 14.5 % (ref 11.5–15.5)
WBC: 10.8 10*3/uL — ABNORMAL HIGH (ref 4.0–10.5)
nRBC: 0 % (ref 0.0–0.2)

## 2019-04-05 LAB — CBC
HCT: 36.6 % (ref 36.0–46.0)
Hemoglobin: 11.7 g/dL — ABNORMAL LOW (ref 12.0–15.0)
MCH: 27.1 pg (ref 26.0–34.0)
MCHC: 32 g/dL (ref 30.0–36.0)
MCV: 84.9 fL (ref 80.0–100.0)
Platelets: 280 10*3/uL (ref 150–400)
RBC: 4.31 MIL/uL (ref 3.87–5.11)
RDW: 14.6 % (ref 11.5–15.5)
WBC: 10.4 10*3/uL (ref 4.0–10.5)
nRBC: 0 % (ref 0.0–0.2)

## 2019-04-05 LAB — BIRTH TISSUE RECOVERY COLLECTION (PLACENTA DONATION)

## 2019-04-05 LAB — CREATININE, SERUM
Creatinine, Ser: 0.87 mg/dL (ref 0.44–1.00)
GFR calc Af Amer: >60 mL/min (ref >60)
GFR calc non Af Amer: >60 mL/min (ref >60)

## 2019-04-05 LAB — ABO/RH: ABO/RH(D): O POS

## 2019-04-05 NOTE — Lactation Note (Signed)
This note was copied from a baby's chart. Lactation Consultation Note Baby 37 hrs old. RN stated baby had been feeding well. Mom has everted nipples. Assisted baby in football position. Baby latched well. Mom demonstrated hand expression w/colostrum noted. Newborn behavior, STS, I&O, positioning, support, breast massage, supplementing, supply and demand. Mom encouraged to feed baby 8-12 times/24 hours and with feeding cues.  Mom encouraged to waken baby for feeds if hasn't cued in 3 hrs. Answered mom's questions. Lactation brochure given. Encouraged to call for assistance if needed.  Patient Name: Connie Morales UDTHY'H Date: 04/05/2019 Reason for consult: Initial assessment;1st time breastfeeding;Term   Maternal Data Has patient been taught Hand Expression?: Yes Does the patient have breastfeeding experience prior to this delivery?: No  Feeding Feeding Type: Breast Fed  LATCH Score Latch: Grasps breast easily, tongue down, lips flanged, rhythmical sucking.  Audible Swallowing: A few with stimulation  Type of Nipple: Everted at rest and after stimulation  Comfort (Breast/Nipple): Soft / non-tender  Hold (Positioning): Assistance needed to correctly position infant at breast and maintain latch.  LATCH Score: 8  Interventions Interventions: Breast feeding basics reviewed;Adjust position;Assisted with latch;Support pillows;Skin to skin;Position options;Breast massage;Hand express;Breast compression  Lactation Tools Discussed/Used WIC Program: No   Consult Status Consult Status: Follow-up Date: 04/06/19 Follow-up type: In-patient    Theodoro Kalata 04/05/2019, 6:24 AM

## 2019-04-05 NOTE — Lactation Note (Signed)
This note was copied from a baby's chart. Lactation Consultation Note  Patient Name: Connie Morales DGLOV'F Date: 04/05/2019 Reason for consult: Follow-up assessment;Mother's request;Term;Primapara;1st time breastfeeding  P1 mother whose infant is now 63 hours old.  Mother requested lactation assistance to answer questions.   Mother had multiple questions related to breast feeding and pumping.  Reviewed the basics and encouraged her to call her RN/LC the next time baby is ready to feed if she needs latch assistance.    She has ordered a DEBP from her insurance company.  Inquired about using some breast shells that she brought from home.  At this time I do not see a need for her to use these.  Her breasts are soft and non tender and nipples are short shafted and intact.  Her breast tissue is compressible and she is able to latch baby.   Mother appreciative of help and will call for assistance as needed.  Father present.   Maternal Data Formula Feeding for Exclusion: No Has patient been taught Hand Expression?: Yes Does the patient have breastfeeding experience prior to this delivery?: No  Feeding Feeding Type: Breast Fed  LATCH Score                   Interventions    Lactation Tools Discussed/Used WIC Program: No   Consult Status Consult Status: Follow-up Date: 04/06/19 Follow-up type: In-patient    Tymika Grilli R Bellatrix Devonshire 04/05/2019, 7:07 PM

## 2019-04-05 NOTE — Progress Notes (Signed)
RN discussed foley removal per protocol with Ranee Gosselin, CNM; CNM instructed to leave foley catheter in place and pt's case will be reviewed with MD in the morning. Pt tolerating catheter well; clear, yellow urine flowing freely. Will continue to monitor pt.

## 2019-04-05 NOTE — Progress Notes (Signed)
MOB was referred for history of anxiety. * Referral screened out by Clinical Social Worker because none of the following criteria appear to apply: ~ History of anxiety/depression during this pregnancy, or of post-partum depression following prior delivery. ~ Diagnosis of anxiety and/or depression within last 3 years OR * MOB's symptoms currently being treated with medication and/or therapy. Per chart review, MOB has an active prescription for hydroxyzine for anxiety.  Please contact the Clinical Social Worker if needs arise, by MOB request, or if MOB scores greater than 9/yes to question 10 on Edinburgh Postpartum Depression Screen.  Alexandera Kuntzman, LCSW Clinical Social Worker [facility] Cell#: [phone]  

## 2019-04-05 NOTE — Progress Notes (Signed)
Postpartum Note Day # 1  S:  Patient resting comfortable in bed.  Pain controlled.  Tolerating general diet. No flatus, no BM.  Lochia minimal.  Ambulating without difficulty.  Foley in place overnight- s/p 2L fluid bolus- urine output now improved. She denies n/v/f/c, SOB, or CP.  Pt plans on breastfeeding.  O: Temp:  [97 F (36.1 C)-98.6 F (37 C)] 97.8 F (36.6 C) (07/22 2238) Pulse Rate:  [56-89] 68 (07/23 0648) Resp:  [7-24] 18 (07/23 0648) BP: (90-116)/(53-70) 104/68 (07/23 0648) SpO2:  [92 %-100 %] 100 % (07/23 0648) Weight:  [99.3 kg] 99.3 kg (07/22 0909)   Gen: A&Ox3, NAD CV: RRR Resp: CTAB Abdomen: obese, soft, NT, ND BS quiet Uterus: firm, non-tender, below umbilicus Incision: c/d/i, bandage on with old blood- no changes from prior marking Ext: No edema, no calf tenderness bilaterally, SCDs in place  Labs:  CBC Latest Ref Rng & Units 04/04/2019 04/04/2019  WBC 4.0 - 10.5 K/uL 10.8(H) 10.1  Hemoglobin 12.0 - 15.0 g/dL 11.1(L) 12.3  Hematocrit 36.0 - 46.0 % 34.4(L) 37.7  Platelets 150 - 400 K/uL 256 282     A/P: Pt is a 33 y.o. G2P1011 s/p primary C-section for breech, POD#1  - Pain well controlled -GU: low UOP overnight, pt asymptomatic, VSS, Hgb stable, suspect dehydration due to vomiting, UOP now adequate, plan to remove foley and continue close monitoring of I/Os -GI: Tolerating general diet -Activity: encouraged sitting up to chair and ambulation as tolerated -Prophylaxis: early ambulation, SCDs while in bed -Labs: stable as above -Baby boy circ to be completed tomorrow  Janyth Pupa, DO [phone] (cell) [phone] (office)

## 2019-04-06 LAB — RPR: RPR Ser Ql: NONREACTIVE

## 2019-04-06 MED ORDER — LIDOCAINE 4 % EX CREA
TOPICAL_CREAM | Freq: Three times a day (TID) | CUTANEOUS | Status: DC | PRN
  Administered 2019-04-06: 1 via TOPICAL
  Filled 2019-04-06: qty 5

## 2019-04-06 NOTE — Progress Notes (Signed)
Subjective: Postop Day 2: Cesarean Delivery No complaints.  Pain controlled.  Lochia normal.  Breast feeding yes,  Pt does not feel like she's 100% yet. Pt has a c/o of burning above honeycomb dressing.  She denies an adhesive allergy but she has eczema.  Heat has not helped.  Pt's pediatrician stated her son may have an abnormality of the penis and wants him to be evaluated by Urology.  Not a candidate for circumcision at this time.  Objective: Temp:  [98.3 F (36.8 C)-98.6 F (37 C)] 98.3 F (36.8 C) (07/24 0530) Pulse Rate:  [58-60] 58 (07/24 0530) Resp:  [16-18] 18 (07/24 0530) BP: (107-130)/(64-71) 130/71 (07/24 0530) SpO2:  [100 %] 100 % (07/24 0530)  Physical Exam: Gen: NAD Lochia: Not visualized Uterine Fundus: firm, appropriately tender Incision: clean, dry and intact, healing well DVT Evaluation: + Edema present, no calf tenderness bilaterally Skin on abdomen:  Normal, no hives or erythema   Recent Labs    04/04/19 2348 04/05/19 0845  HGB 11.1* 11.7*  HCT 34.4* 36.6    Assessment/Plan: Status post C-section-doing well postoperatively. Burning of skin may be due to attempted version.  Recommend ice.  If no improvement, topical Lidocaine cream TID prn has been ordered. H/o anxiety  SS consult.  Discussed risk of PP anxiety and/or depression.  Will f/u 2 weeks post op.  Anticipate discharge tomorrow.    Thurnell Lose 04/06/2019, 1:08 PM

## 2019-04-06 NOTE — Lactation Note (Signed)
This note was copied from a baby's chart. Lactation Consultation Note  Patient Name: Connie Morales [id number] Date: 04/06/2019 Reason for consult: Follow-up assessment;1st time breastfeeding;Mother's request P1, 72 hour female infant, weight loss -5% Mom has been breastfeeding and supplementing with formula. Per mom, she used DEBP earlier and got 15 ml of EBM. Mom wants LC to assess latch. LC discussed with mom use cross cradle hold, she mainly been using football hold position. LC discussed with mom rub breast below infant's nose and wait until infant's tongue is down, infant latched with nose and chin touching breast. Mom felt this was a good breast feeding positions, LC had mom break latch and try re-latching infant herself without LC assistance. Mom latched infant without difficulty and infant was still breastfeeding for 15 minutes as LC left the room. LC reviewed STS and breastfeeding techniques to keep infant awake at breast. Mom knows to call Nurse or [place] if she has any further questions, concerns or need assistance with latching infant to breast.  Mom's plan: 1. Breastfeed according hunger cues, 8 to 12 times within 24 hours and on demand. 2. Mom will use DEBP  after breastfeeding infant at breast and give infant back EBM first before offering formula supplement. 3. Mom will breastfeed infant and supplement infant according infant feeding amounts based on infant's age/ hours of life.    Maternal Data    Feeding Feeding Type: Breast Fed  LATCH Score Latch: Grasps breast easily, tongue down, lips flanged, rhythmical sucking.  Audible Swallowing: Spontaneous and intermittent  Type of Nipple: Everted at rest and after stimulation  Comfort (Breast/Nipple): Soft / non-tender  Hold (Positioning): Assistance needed to correctly position infant at breast and maintain latch.  LATCH Score: 9  Interventions Interventions: Assisted with latch;Skin to skin;Position options;Support  pillows;Adjust position;Breast compression;Expressed milk;Hand express  Lactation Tools Discussed/Used     Consult Status Consult Status: Follow-up Date: 04/07/19 Follow-up type: In-patient    Vicente Serene 04/06/2019, 11:38 PM

## 2019-04-06 NOTE — Op Note (Signed)
NAMBing Neighbors: Bee, Nathasha MEDICAL RECORD [id number] ACCOUNT [id number] DATE OF BIRTH:September 17, 1985 FACILITY: [facility] LOCATION: [facility] PHYSICIAN:Ebone Alcivar Derrell LollingB. Colie Fugitt, MD  OPERATIVE REPORT  DATE OF PROCEDURE:  04/04/2019  PREOPERATIVE DIAGNOSES:  Intrauterine pregnancy at 7039 and 0/7th weeks, breech presentation with failed external version.  PREOPERATIVE DIAGNOSES:  Intrauterine pregnancy at 6139 and 0/7th weeks, breech presentation with failed external version.  PROCEDURE:  Primary low transverse cesarean section with 2-layer closure.  SURGEON:  Geryl RankinsEvelyn Pietro Bonura, MD  ASSISTANT:  Technician.  ANESTHESIA:  Spinal.  ESTIMATED BLOOD LOSS:  99 mL  DRAINS:  Foley catheter.  SPECIMEN:  None.  DISPOSITION:  To PACU hemodynamically stable.  FINDINGS:  Single footling breech, nuchal cord x2.  Normal appearing viable female.  Normal uterus, fallopian tubes and ovaries bilaterally.  DESCRIPTION OF PROCEDURE:  We attempted a version unsuccessfully.  Fetal status was reassuring.  At the end of the procedure, the patient was then counseled on a primary C-section due to breech presentation.  She was taken to the operating room with IV  running.  She underwent spinal anesthesia without complication.  She was then placed in the dorsal supine position with a leftward tilt.  She was prepped and draped in normal sterile fashion.  SCDs were on her legs and operating.  Ancef 2 grams IV was  administered prior to making the first incision.  Timeout was performed.  DESCRIPTION OF PROCEDURE:  After adequate anesthesia was confirmed and abdomen was marked, a Pfannenstiel skin incision was made and carried down to the underlying layer of the fascia that was incised with the Bovie.  The incision was then extended  laterally with the curved Mayo scissors.  The rectus muscles were dissected off of the fascia above and below sharply.  The rectus muscles were then separated at the midline.  The peritoneum was identified, tented  up with hemostat x2 and entered sharply  with the Metzenbaum scissors.  Peritoneum was then stretched.  Uterus and intra-abdominal cavity was palpated.  Alexis retractor was then placed.  Lower uterine segment was identified.  There was a large vessel on the right lower uterine segment.  I grabbed 2 hemostats and I used the Bovie to cauterize that then developed the bladder flap grabbing the serosa with the Metzenbaum scissors and the  Russian's and there was no excessive bleeding near that vessel that was caterized.  A transverse incision was made with the scalpel and extended laterally with the bandage scissors.  The breech was brought to the incision.  The right foot was not visible at the incision, but was brought down easily, same with the left.  The baby was  delivered in the usual fashion.  A moist blue towel was placed right before the arms were delivered and the head was delivered atraumatically.  Nuchal cord x2 was manually reduced.  Delayed cord clamping was performed.  Baby was suctioned and wiped off on the  field.  While we were waiting, the hysterotomy incision was clamped with ring forceps.  Once 1 minute was reached, the cord was clamped x2 and cut and baby was handed off to the awaiting staff.  The cord blood was obtained.  Placenta was then removed.   The uterus was cleared of all clots and debris.  The cavity did not have any abnormalities or abnormal shape.  The incision was then closed with 0 Vicryl in a continuous locked fashion.  The same suture was used for imbrication.  There was no bleeding.  No other sutures  were needed for hemostasis.  The abdomen was copiously irrigated and all clots were removed.   Of note, we did put one laparotomy sponge on the right side to displace the small bowel.  That was removed prior to irrigation.  Interceed was applied to the lower uterine segment.  The peritoneum was then reapproximated with 2-0 Vicryl in a continuous running fashion.  The  fascia was reapproximated with 0 Vicryl in a continuous fashion.  Subcutaneous space was closed with 2-0  plain gut in a running fashion and the skin was reapproximated with 4-0 Monocryl in a subcuticular fashion.  Steri-Strips, benzoin and honeycomb dressing were applied.  Prior to closure of every layer, there was irrigation and thorough inspection for any  bleeding or any abnormalities.  Baby remained in the OR in stable condition with the parents.  No complications.  TN/NUANCE  D:04/06/2019 T:04/06/2019 JOB:[id number]

## 2019-04-06 NOTE — Lactation Note (Signed)
This note was copied from a baby's chart. Lactation Consultation Note  Patient Name: Connie Morales [id number] Date: 04/06/2019 Reason for consult: Follow-up assessment;Term;Infant < 6lbs Baby is 45 hours old/5% weight loss.  Mom is both breastfeeding and formula feeding baby.  She states baby does well latching in football hold.  Discussed milk coming to volume and the prevention and treatment of engorgement.  Encouraged frequent breastfeeding to stimulate milk supply.  Answered questions.  Encouraged to call out for assist prn.  Maternal Data    Feeding    LATCH Score                   Interventions    Lactation Tools Discussed/Used     Consult Status Consult Status: Follow-up Date: 04/07/19 Follow-up type: In-patient    Ave Filter 04/06/2019, 9:59 AM

## 2019-04-07 MED ORDER — OXYCODONE HCL 5 MG PO TABS: 5.0000 mg | ORAL_TABLET | ORAL | 0 refills | Status: DC | PRN

## 2019-04-07 MED ORDER — ACETAMINOPHEN 500 MG PO TABS: 1000.0000 mg | ORAL_TABLET | Freq: Four times a day (QID) | ORAL | 0 refills | Status: DC | PRN

## 2019-04-07 MED ORDER — IBUPROFEN 600 MG PO TABS: 600.0000 mg | ORAL_TABLET | Freq: Four times a day (QID) | ORAL | 1 refills | Status: DC | PRN

## 2019-04-07 NOTE — Progress Notes (Signed)
CSW received consult for hx of Anxiety and Depression.  CSW met with MOB to offer support and complete assessment.    When CSW arrived to room 502 MOB was bonding with infant as evidence by engaging in breastfeeding. FOB was also present and remained in the restroom during the assessment.  CSW explained CSW's role and MOB gave CSW permission to complete the assessment while FOB was present.  Initially, MOB appeared flat but was she receptive to meeting with CSW.  As the assessment concluded it appeared that MOB's  mood had increased as evidence by her laughter and smiles.   CSW asked about MOB's MH hx and MOB acknowledged a hx of anxiety.  Per MOB has test anxiety and often times treats her symptoms with natural supplements (B6 and HTP).  MOB also shared that she is nervous about parenting because, "parenting does not come with babies do not come with instructions. CSW validated and normalized MOB's thoughts and feelings.  CSW provided education regarding the baby blues period vs. perinatal mood disorders, discussed treatment and gave resources for mental health follow up if concerns arise.  CSW recommends self-evaluation during the postpartum time period using the New Mom Checklist from Postpartum Progress and encouraged MOB to contact a medical professional if symptoms are noted at any time.  MOB reported having a good support team that consists of FOB's family and MOB's family.  CSW assessed for safety and MOB denied SI and HI.   CSW provided review of Sudden Infant Death Syndrome (SIDS) precautions.    CSW identifies no further need for intervention and no barriers to discharge at this time.  Angi Goodell Boyd-Gilyard, MSW, LCSW Clinical Social Work [phone] 

## 2019-04-07 NOTE — Plan of Care (Signed)
  Problem: Clinical Measurements: Goal: Ability to maintain clinical measurements within normal limits will improve Outcome: Completed/Met Goal: Will remain free from infection Outcome: Completed/Met Goal: Diagnostic test results will improve Outcome: Completed/Met   Problem: Elimination: Goal: Will not experience complications related to bowel motility Outcome: Completed/Met   Problem: Pain Managment: Goal: General experience of comfort will improve Outcome: Completed/Met   Problem: Coping: Goal: Ability to identify and utilize available resources and services will improve Outcome: Completed/Met

## 2019-04-07 NOTE — Lactation Note (Signed)
This note was copied from a baby's chart. Lactation Consultation Note  Patient Name: Connie Morales [id number] Date: 04/07/2019 Reason for consult: Follow-up assessment;Term;Primapara;1st time breastfeeding  P1 mother whose infant is now 6 hours old.  Mother has continued to breast/bottle feed.  Mother stated that occasionally baby does not want to latch.  However, when he latches she feels like he feeds well and she can hear swallows.  Encouraged her to continue latching first prior to any formula supplement.  Explained how important it will be to continue practicing with him and not allowing him to take an artificial nipple immediately.  Mother verbalized understanding.  She has been pumping and currently has approximately 20 mls of EBM at bedside.  She knows to use her EBM prior to any formula.  Engorgement prevention/treatment reviewed.  Manual pump with instructions provided.  Encouraged mother to use her EBM to rub into nipples/areolas for comfort.  Nipples are intact and mother states no pain with latching.    She has received her DEBP from the insurance company.  Mother has our OP phone number for questions after discharge.  Father present.   Maternal Data Formula Feeding for Exclusion: Yes Reason for exclusion: Mother's choice to formula and breast feed on admission Has patient been taught Hand Expression?: Yes Does the patient have breastfeeding experience prior to this delivery?: No  Feeding Feeding Type: Bottle Fed - Formula  LATCH Score                   Interventions    Lactation Tools Discussed/Used     Consult Status Consult Status: Complete Date: 04/07/19 Follow-up type: Call as needed    Zahara Rembert R Teyah Rossy 04/07/2019, 9:02 AM

## 2019-04-07 NOTE — Discharge Summary (Signed)
OB Discharge Summary     Patient Name: Connie Morales DOB: 1986-08-16 MRN: [id number]  Date of admission: 04/04/2019 Delivering MD: Thurnell Lose   Date of discharge: 04/07/2019  Admitting diagnosis: pregnacy Intrauterine pregnancy: [redacted]w[redacted]d     Secondary diagnosis:  Active Problems:   Breech presentation s/p Primary cesarean section   Additional problems: Anxiety      Discharge diagnosis: Term Pregnancy Delivered                                                                                                Post partum procedures:None  Augmentation: NA  Complications: None  Hospital course:  Sceduled C/S   33 y.o. yo G2P0010 at [redacted]w[redacted]d was admitted to the hospital 04/04/2019 for scheduled cesarean section with the following indication:Malpresentation.  Membrane Rupture Time/Date: 12:05 PM ,04/04/2019   Patient delivered a Viable infant.04/04/2019  Details of operation can be found in separate operative note.  Pateint had an uncomplicated postpartum course.  She is ambulating, tolerating a regular diet, passing flatus, and urinating well. Patient is discharged home in stable condition on  04/07/19         Physical exam  Vitals:   04/05/19 2231 04/06/19 0530 04/06/19 1445 04/06/19 2146  BP: 107/64 130/71 100/72 122/84  Pulse: 60 (!) 58 (!) 57 (!) 55  Resp: 16 18 18 18   Temp: 98.6 F (37 C) 98.3 F (36.8 C) 98.5 F (36.9 C) 97.7 F (36.5 C)  TempSrc: Oral Oral Oral   SpO2: 100% 100%  100%  Weight:      Height:       General: alert, cooperative and no distress Lochia: appropriate Uterine Fundus: firm Incision: Healing well with no significant drainage DVT Evaluation: No evidence of DVT seen on physical exam. Labs: Lab Results  Component Value Date   WBC 10.4 04/05/2019   HGB 11.7 (L) 04/05/2019   HCT 36.6 04/05/2019   MCV 84.9 04/05/2019   PLT 280 04/05/2019   CMP Latest Ref Rng & Units 04/05/2019  Glucose 70 - 99 mg/dL -  BUN 6 - 20 mg/dL -  Creatinine 0.44 -  1.00 mg/dL 0.87  Sodium 135 - 145 mmol/L -  Potassium 3.5 - 5.1 mmol/L -  Chloride 98 - 111 mmol/L -  CO2 22 - 32 mmol/L -  Calcium 8.9 - 10.3 mg/dL -  Total Protein 6.5 - 8.1 g/dL -  Total Bilirubin 0.3 - 1.2 mg/dL -  Alkaline Phos 38 - 126 U/L -  AST 15 - 41 U/L -  ALT 0 - 44 U/L -    Discharge instruction: per After Visit Summary and "Baby and Me Booklet".  After visit meds:  Allergies as of 04/07/2019      Reactions   Fish Allergy Hives      Medication List    TAKE these medications   acetaminophen 500 MG tablet Commonly known as: TYLENOL Take 2 tablets (1,000 mg total) by mouth every 6 (six) hours as needed.   albuterol (2.5 MG/3ML) 0.083% nebulizer solution Commonly known as: PROVENTIL Take 2.5 mg by nebulization every 6 (  six) hours as needed for wheezing or shortness of breath.   famotidine 20 MG tablet Commonly known as: PEPCID Take 20 mg by mouth 2 (two) times daily.   ibuprofen 600 MG tablet Commonly known as: ADVIL Take 1 tablet (600 mg total) by mouth every 6 (six) hours as needed for moderate pain or cramping.   oxyCODONE 5 MG immediate release tablet Commonly known as: Oxy IR/ROXICODONE Take 1-2 tablets (5-10 mg total) by mouth every 4 (four) hours as needed for moderate pain or severe pain.   prenatal multivitamin Tabs tablet Take 1 tablet by mouth daily at 12 noon.       Diet: routine diet  Activity: Advance as tolerated. Pelvic rest for 6 weeks.   Outpatient follow up:2 weeks Follow up Appt:No future appointments. Follow up Visit:No follow-ups on file.  Postpartum contraception: Not Discussed  Newborn Data: Live born female  Birth Weight: 6 lb 2.2 oz (2785 g) APGAR: 8, 9  Newborn Delivery   Birth date/time: 04/04/2019 12:06:00 Delivery type: C-Section, Low Transverse Trial of labor: No C-section categorization: Primary      Baby Feeding: Breast Disposition:home with mother   04/07/2019 Gerald Leitzara Lenard Kampf, MD

## 2019-07-04 ENCOUNTER — Encounter (HOSPITAL_COMMUNITY): Payer: Self-pay

## 2021-03-03 NOTE — Patient Instructions (Signed)
Breast Self-Awareness Breast self-awareness is knowing how your breasts look and feel. Doing breast self-awareness is important. It allows you to catch a breast problem early while it is still small and can be treated. All women should do breast self-awareness, including women who have had breast implants. Tell your doctorif you notice a change in your breasts. What you need: A mirror. A well-lit room. How to do a breast self-exam A breast self-exam is one way to learn what is normal for your breasts and tocheck for changes. To do a breast self-exam: Look for changes  Take off all the clothes above your waist. Stand in front of a mirror in a room with good lighting. Put your hands on your hips. Push your hands down. Look at your breasts and nipples in the mirror to see if one breast or nipple looks different from the other. Check to see if: The shape of one breast is different. The size of one breast is different. There are wrinkles, dips, and bumps in one breast and not the other. Look at each breast for changes in the skin, such as: Redness. Scaly areas. Look for changes in your nipples, such as: Liquid around the nipples. Bleeding. Dimpling. Redness. A change in where the nipples are.  Feel for changes  Lie on your back on the floor. Feel each breast. To do this, follow these steps: Pick a breast to feel. Put the arm closest to that breast above your head. Use your other arm to feel the nipple area of your breast. Feel the area with the pads of your three middle fingers by making small circles with your fingers. For the first circle, press lightly. For the second circle, press harder. For the third circle, press even harder. Keep making circles with your fingers at the different pressures as you move down your breast. Stop when you feel your ribs. Move your fingers a little toward the center of your body. Start making circles with your fingers again, this time going up until  you reach your collarbone. Keep making up-and-down circles until you reach your armpit. Remember to keep using the three pressures. Feel the other breast in the same way. Sit or stand in the tub or shower. With soapy water on your skin, feel each breast the same way you did in step 2 when you were lying on the floor.  Write down what you find Writing down what you find can help you remember what to tell your doctor. Write down: What is normal for each breast. Any changes you find in each breast, including: The kind of changes you find. Whether you have pain. Size and location of any lumps. When you last had your menstrual period. General tips Check your breasts every month. If you are breastfeeding, the best time to check your breasts is after you feed your baby or after you use a breast pump. If you get menstrual periods, the best time to check your breasts is 5-7 days after your menstrual period is over. With time, you will become comfortable with the self-exam, and you will begin to know if there are changes in your breasts. Contact a doctor if you: See a change in the shape or size of your breasts or nipples. See a change in the skin of your breast or nipples, such as red or scaly skin. Have fluid coming from your nipples that is not normal. Find a lump or thick area that was not there before. Have pain in   your breasts. Have any concerns about your breast health. Summary Breast self-awareness includes looking for changes in your breasts, as well as feeling for changes within your breasts. Breast self-awareness should be done in front of a mirror in a well-lit room. You should check your breasts every month. If you get menstrual periods, the best time to check your breasts is 5-7 days after your menstrual period is over. Let your doctor know of any changes you see in your breasts, including changes in size, changes on the skin, pain or tenderness, or fluid from your nipples that is  not normal. This information is not intended to replace advice given to you by your health care provider. Make sure you discuss any questions you have with your healthcare provider. Document Revised: 04/18/2018 Document Reviewed: 04/18/2018 Elsevier Patient Education  2022 Elsevier Inc. Preventive Care 21-39 Years Old, Female Preventive care refers to lifestyle choices and visits with your health care provider that can promote health and wellness. This includes: A yearly physical exam. This is also called an annual wellness visit. Regular dental and eye exams. Immunizations. Screening for certain conditions. Healthy lifestyle choices, such as: Eating a healthy diet. Getting regular exercise. Not using drugs or products that contain nicotine and tobacco. Limiting alcohol use. What can I expect for my preventive care visit? Physical exam Your health care provider may check your: Height and weight. These may be used to calculate your BMI (body mass index). BMI is a measurement that tells if you are at a healthy weight. Heart rate and blood pressure. Body temperature. Skin for abnormal spots. Counseling Your health care provider may ask you questions about your: Past medical problems. Family's medical history. Alcohol, tobacco, and drug use. Emotional well-being. Home life and relationship well-being. Sexual activity. Diet, exercise, and sleep habits. Work and work environment. Access to firearms. Method of birth control. Menstrual cycle. Pregnancy history. What immunizations do I need?  Vaccines are usually given at various ages, according to a schedule. Your health care provider will recommend vaccines for you based on your age, medicalhistory, and lifestyle or other factors, such as travel or where you work. What tests do I need?  Blood tests Lipid and cholesterol levels. These may be checked every 5 years starting at age 20. Hepatitis C test. Hepatitis B  test. Screening Diabetes screening. This is done by checking your blood sugar (glucose) after you have not eaten for a while (fasting). STD (sexually transmitted disease) testing, if you are at risk. BRCA-related cancer screening. This may be done if you have a family history of breast, ovarian, tubal, or peritoneal cancers. Pelvic exam and Pap test. This may be done every 3 years starting at age 21. Starting at age 30, this may be done every 5 years if you have a Pap test in combination with an HPV test. Talk with your health care provider about your test results, treatment options,and if necessary, the need for more tests. Follow these instructions at home: Eating and drinking  Eat a healthy diet that includes fresh fruits and vegetables, whole grains, lean protein, and low-fat dairy products. Take vitamin and mineral supplements as recommended by your health care provider. Do not drink alcohol if: Your health care provider tells you not to drink. You are pregnant, may be pregnant, or are planning to become pregnant. If you drink alcohol: Limit how much you have to 0-1 drink a day. Be aware of how much alcohol is in your drink. In the [place] one   drink equals one 12 oz bottle of beer (355 mL), one 5 oz glass of wine (148 mL), or one 1 oz glass of hard liquor (44 mL).  Lifestyle Take daily care of your teeth and gums. Brush your teeth every morning and night with fluoride toothpaste. Floss one time each day. Stay active. Exercise for at least 30 minutes 5 or more days each week. Do not use any products that contain nicotine or tobacco, such as cigarettes, e-cigarettes, and chewing tobacco. If you need help quitting, ask your health care provider. Do not use drugs. If you are sexually active, practice safe sex. Use a condom or other form of protection to prevent STIs (sexually transmitted infections). If you do not wish to become pregnant, use a form of birth control. If you plan to become  pregnant, see your health care provider for a prepregnancy visit. Find healthy ways to cope with stress, such as: Meditation, yoga, or listening to music. Journaling. Talking to a trusted person. Spending time with friends and family. Safety Always wear your seat belt while driving or riding in a vehicle. Do not drive: If you have been drinking alcohol. Do not ride with someone who has been drinking. When you are tired or distracted. While texting. Wear a helmet and other protective equipment during sports activities. If you have firearms in your house, make sure you follow all gun safety procedures. Seek help if you have been physically or sexually abused. What's next? Go to your health care provider once a year for an annual wellness visit. Ask your health care provider how often you should have your eyes and teeth checked. Stay up to date on all vaccines. This information is not intended to replace advice given to you by your health care provider. Make sure you discuss any questions you have with your healthcare provider. Document Revised: 04/27/2020 Document Reviewed: 05/11/2018 Elsevier Patient Education  2022 Elsevier Inc.  

## 2021-03-04 ENCOUNTER — Other Ambulatory Visit: Payer: Self-pay

## 2021-03-04 ENCOUNTER — Ambulatory Visit (INDEPENDENT_AMBULATORY_CARE_PROVIDER_SITE_OTHER): Payer: BC Managed Care – PPO

## 2021-03-04 ENCOUNTER — Ambulatory Visit (INDEPENDENT_AMBULATORY_CARE_PROVIDER_SITE_OTHER): Payer: BC Managed Care – PPO | Admitting: Obstetrics and Gynecology

## 2021-03-04 ENCOUNTER — Encounter: Payer: Self-pay | Admitting: Podiatry

## 2021-03-04 ENCOUNTER — Encounter: Payer: Self-pay | Admitting: Obstetrics and Gynecology

## 2021-03-04 ENCOUNTER — Ambulatory Visit: Payer: BC Managed Care – PPO | Admitting: Podiatry

## 2021-03-04 VITALS — BP 114/67 | HR 68 | Temp 97.6°F | Resp 16

## 2021-03-04 VITALS — BP 112/75 | HR 70 | Ht 63.0 in | Wt 202.5 lb

## 2021-03-04 DIAGNOSIS — M779 Enthesopathy, unspecified: Secondary | ICD-10-CM | POA: Diagnosis not present

## 2021-03-04 DIAGNOSIS — S93401S Sprain of unspecified ligament of right ankle, sequela: Secondary | ICD-10-CM

## 2021-03-04 DIAGNOSIS — S93402S Sprain of unspecified ligament of left ankle, sequela: Secondary | ICD-10-CM

## 2021-03-04 DIAGNOSIS — Z7689 Persons encountering health services in other specified circumstances: Secondary | ICD-10-CM

## 2021-03-04 DIAGNOSIS — O924 Hypogalactia: Secondary | ICD-10-CM | POA: Diagnosis not present

## 2021-03-04 DIAGNOSIS — E669 Obesity, unspecified: Secondary | ICD-10-CM

## 2021-03-04 DIAGNOSIS — M25372 Other instability, left ankle: Secondary | ICD-10-CM

## 2021-03-04 DIAGNOSIS — Z01419 Encounter for gynecological examination (general) (routine) without abnormal findings: Secondary | ICD-10-CM

## 2021-03-04 NOTE — Patient Instructions (Signed)
Let me know where you want to go for physical therapy

## 2021-03-04 NOTE — Progress Notes (Signed)
GYNECOLOGY ANNUAL PHYSICAL EXAM PROGRESS NOTE  Subjective:    Connie Morales is a 35 y.o. G1P1011 female who presents for to establish care, and for an annual exam.  She reports that she has not seen an OB/GYN in ~ 2 years since the birth of her last child. The patient is sexually active. The patient wears seatbelts: yes. The patient participates in regular exercise: no. Has the patient ever been transfused or tattooed?: no. The patient reports that there is not domestic violence in her life.    The patient has the following complaints today:  She complains that she has not been able to conceive again.  Has been attempting for "some time". Reports that it only took a few months with conception of her last child. Is tracking her menstrual cycles which are regularly occurring.  Yitty notes that she is concerned about her milk supply.  Is still currently breastfeeding her child (almost 33 years old), and recently her milk supply has significantly reduced without cause.  Is currently consuming oatmeal regularly, consuming beer yeast. Usually breastfeeds twice daily (in a.m. and around lunchtime).     Menstrual History: Menarche age: 59 or 70 Patient's last menstrual period was 02/27/2021. Period Duration (Days): 5 Period Pattern: Regular Menstrual Flow: Heavy, Moderate, Light Menstrual Control: (S) Maxi pad, Other (Comment) (menstral cup) Menstrual Control Change Freq (Hours): 3-8 Dysmenorrhea: None Dysmenorrhea Symptoms: (S) Other (Comment) (back pain)   Gynecologic History:  Contraception: none History of STI's: Denies Last Pap: 09/28/2018. Results were: normal.  Denies h/o abnormal pap smears.     Upstream - 03/04/21 1522       Pregnancy Intention Screening   Does the patient want to become pregnant in the next year? Yes    Does the patient's partner want to become pregnant in the next year? Ok Either Way    Would the patient like to discuss contraceptive options today? No       Contraception Wrap Up   Current Method No Method - Other Reason            The pregnancy intention screening data noted above was reviewed. Potential methods of contraception were discussed. The patient elected to proceed with No Method - Other Reason.   OB History  Gravida Para Term Preterm AB Living  '2 1 1 ' 0 1 1  SAB IAB Ectopic Multiple Live Births  0 1 0 0 1    # Outcome Date GA Lbr Len/2nd Weight Sex Delivery Anes PTL Lv  2 Term 04/04/19 [redacted]w[redacted]d  M CS-Unspec   LIV  1 IAB             Past Medical History:  Diagnosis Date   Anxiety    Asthma     Past Surgical History:  Procedure Laterality Date   CESAREAN SECTION N/A 04/04/2019   Procedure: CESAREAN SECTION;  Surgeon: VThurnell Lose MD;  Location: [facility];  Service: Obstetrics;  Laterality: N/A;   WISDOM TOOTH EXTRACTION      Family History  Problem Relation Age of Onset   Diabetes Mother    Heart failure Mother    Hypertension Father    Kidney cancer Father    Polycystic ovary syndrome Sister    Thalassemia Sister    Heart failure Maternal Grandmother    Hypertension Maternal Grandmother    Anxiety disorder Maternal Grandmother    Anemia Maternal Grandmother    Thalassemia Maternal Grandmother    Heart failure Maternal Grandfather  Healthy Paternal Grandmother     Social History   Socioeconomic History   Marital status: Unknown    Spouse name: Not on file   Number of children: Not on file   Years of education: Not on file   Highest education level: Not on file  Occupational History   Not on file  Tobacco Use   Smoking status: Never   Smokeless tobacco: Never  Vaping Use   Vaping Use: Former  Substance and Sexual Activity   Alcohol use: Yes    Comment: SOCIALLY   Drug use: Never   Sexual activity: Yes    Birth control/protection: None  Other Topics Concern   Not on file  Social History Narrative   Not on file   Social Determinants of Health   Financial Resource Strain: Not on  file  Food Insecurity: Not on file  Transportation Needs: Not on file  Physical Activity: Not on file  Stress: Not on file  Social Connections: Not on file  Intimate Partner Violence: Not on file    Current Outpatient Medications on File Prior to Visit  Medication Sig Dispense Refill   acetaminophen (TYLENOL) 500 MG tablet Take 2 tablets (1,000 mg total) by mouth every 6 (six) hours as needed. 30 tablet 0   albuterol (PROVENTIL) (2.5 MG/3ML) 0.083% nebulizer solution Take 2.5 mg by nebulization every 6 (six) hours as needed for wheezing or shortness of breath.     famotidine (PEPCID) 20 MG tablet Take 20 mg by mouth 2 (two) times daily.     ibuprofen (ADVIL) 600 MG tablet Take 1 tablet (600 mg total) by mouth every 6 (six) hours as needed for moderate pain or cramping. 30 tablet 1   No current facility-administered medications on file prior to visit.    Allergies  Allergen Reactions   Fish Allergy Hives   Kiwi Extract Itching    Mouth itching      Review of Systems Constitutional: negative for chills, fatigue, fevers and sweats Eyes: negative for irritation, redness and visual disturbance Ears, nose, mouth, throat, and face: negative for hearing loss, nasal congestion, snoring and tinnitus Respiratory: negative for asthma, cough, sputum Cardiovascular: negative for chest pain, dyspnea, exertional chest pressure/discomfort, irregular heart beat, palpitations and syncope Gastrointestinal: negative for abdominal pain, change in bowel habits, nausea and vomiting Genitourinary: negative for abnormal menstrual periods, genital lesions, sexual problems and vaginal discharge, dysuria and urinary incontinence Integument/breast: negative for breast lump, breast tenderness and nipple discharge Hematologic/lymphatic: negative for bleeding and easy bruising Musculoskeletal:negative for back pain and muscle weakness Neurological: negative for dizziness, headaches, vertigo and  weakness Endocrine: negative for diabetic symptoms including polydipsia, polyuria and skin dryness Allergic/Immunologic: negative for hay fever and urticaria        Objective:  Blood pressure 112/75, pulse 70, height '5\' 3"'  (1.6 m), weight 202 lb 8 oz (91.9 kg), last menstrual period 02/27/2021, currently breastfeeding.  Body mass index is 35.87 kg/m.  General Appearance:    Alert, cooperative, no distress, appears stated age, moderate obesity  Head:    Normocephalic, without obvious abnormality, atraumatic  Eyes:    PERRL, conjunctiva/corneas clear, EOM's intact, both eyes  Ears:    Normal external ear canals, both ears  Nose:   Nares normal, septum midline, mucosa normal, no drainage or sinus tenderness  Throat:   Lips, mucosa, and tongue normal; teeth and gums normal  Neck:   Supple, symmetrical, trachea midline, no adenopathy; thyroid: no enlargement/tenderness/nodules; no carotid bruit or JVD  Back:     Symmetric, no curvature, ROM normal, no CVA tenderness  Lungs:     Clear to auscultation bilaterally, respirations unlabored  Chest Wall:    No tenderness or deformity   Heart:    Regular rate and rhythm, S1 and S2 normal, no murmur, rub or gallop  Breast Exam:    No tenderness, masses, or nipple abnormality  Abdomen:     Soft, non-tender, bowel sounds active all four quadrants, no masses, no organomegaly.    Genitalia:    Pelvic:external genitalia normal, vagina without lesions, discharge, or tenderness, rectovaginal septum  normal. Cervix normal in appearance, no cervical motion tenderness, no adnexal masses or tenderness.  Uterus normal size, shape, mobile, regular contours, nontender.  Rectal:    Normal external sphincter.  No hemorrhoids appreciated. Internal exam not done.   Extremities:   Extremities normal, atraumatic, no cyanosis or edema  Pulses:   2+ and symmetric all extremities  Skin:   Skin color, texture, turgor normal, no rashes or lesions  Lymph nodes:   Cervical,  supraclavicular, and axillary nodes normal  Neurologic:   CNII-XII intact, normal strength, sensation and reflexes throughout   .  Labs:  Lab Results  Component Value Date   WBC 10.4 04/05/2019   HGB 11.7 (L) 04/05/2019   HCT 36.6 04/05/2019   MCV 84.9 04/05/2019   PLT 280 04/05/2019    Lab Results  Component Value Date   CREATININE 0.87 04/05/2019   BUN 6 04/04/2019   NA 137 04/04/2019   K 3.9 04/04/2019   CL 107 04/04/2019   CO2 22 04/04/2019    Lab Results  Component Value Date   ALT 22 04/04/2019   AST 33 04/04/2019   ALKPHOS 123 04/04/2019   BILITOT 0.5 04/04/2019    No results found for: TSH   Assessment:   1. Encounter to establish care with new doctor   2. Well woman exam with routine gynecological exam   3. Lactating mother   4. Decreased milk production   5. Obesity (BMI 35.0-39.9 without comorbidity)    Plan:     Blood tests: CBC with diff, Comprehensive metabolic panel, Lipoproteins, TSH, and Hemoglobin A1c. Breast self exam technique reviewed and patient encouraged to perform self-exam monthly. Contraception: none. Discussed healthy lifestyle modifications. Pap smear up to date. Due in 1 year. COVID vaccination status:  has completed Pfizer series, is eligible for booster.  Discussed concerns with fertility. Advised to perform ovulation kit during next cycle. If no ovulation noted, may be secondary to lactating status.  If ovulation is present, can discuss further methods to enhance fertility.  Lactation concerns, decreased milk supply. Advised on Fenugreek and other supplements, as well as increasing feeds or pumping. If still no improvement, can consider Reglan.  RTC in 1 year for annual exam. F/u sooner if desired for further management of fertility.    Rubie Maid, MD [facility]

## 2021-03-04 NOTE — Progress Notes (Signed)
New Pt present to est care with a new provider. Pt is currently breastfeeding. Pt's LMP 02/27/2021 last pap 09/28/2018. Pt stated that she would like to have another child in the next year and would like to discuss having a health pregnancy. Pt doing well no problems.

## 2021-03-05 ENCOUNTER — Encounter: Payer: Self-pay | Admitting: Podiatry

## 2021-03-05 LAB — LIPID PANEL
Chol/HDL Ratio: 3.3 ratio (ref 0.0–4.4)
Cholesterol, Total: 137 mg/dL (ref 100–199)
HDL: 42 mg/dL (ref >39)
LDL Chol Calc (NIH): 78 mg/dL (ref 0–99)
Triglycerides: 86 mg/dL (ref 0–149)
VLDL Cholesterol Cal: 17 mg/dL (ref 5–40)

## 2021-03-05 LAB — COMPREHENSIVE METABOLIC PANEL
ALT: 25 IU/L (ref 0–32)
AST: 19 IU/L (ref 0–40)
Albumin/Globulin Ratio: 1.6 (ref 1.2–2.2)
Albumin: 4.2 g/dL (ref 3.8–4.8)
Alkaline Phosphatase: 96 IU/L (ref 44–121)
BUN/Creatinine Ratio: 13 (ref 9–23)
BUN: 11 mg/dL (ref 6–20)
Bilirubin Total: 0.2 mg/dL (ref 0.0–1.2)
CO2: 23 mmol/L (ref 20–29)
Calcium: 9.4 mg/dL (ref 8.7–10.2)
Chloride: 103 mmol/L (ref 96–106)
Creatinine, Ser: 0.88 mg/dL (ref 0.57–1.00)
Globulin, Total: 2.7 g/dL (ref 1.5–4.5)
Glucose: 79 mg/dL (ref 65–99)
Potassium: 4.6 mmol/L (ref 3.5–5.2)
Sodium: 142 mmol/L (ref 134–144)
Total Protein: 6.9 g/dL (ref 6.0–8.5)
eGFR: 89 mL/min/{1.73_m2} (ref >59)

## 2021-03-05 LAB — CBC
Hematocrit: 40.3 % (ref 34.0–46.6)
Hemoglobin: 12.9 g/dL (ref 11.1–15.9)
MCH: 27.3 pg (ref 26.6–33.0)
MCHC: 32 g/dL (ref 31.5–35.7)
MCV: 85 fL (ref 79–97)
Platelets: 367 10*3/uL (ref 150–450)
RBC: 4.73 x10E6/uL (ref 3.77–5.28)
RDW: 12.7 % (ref 11.7–15.4)
WBC: 6.1 10*3/uL (ref 3.4–10.8)

## 2021-03-05 LAB — TSH: TSH: 2.86 u[IU]/mL (ref 0.450–4.500)

## 2021-03-05 LAB — HEMOGLOBIN A1C
Est. average glucose Bld gHb Est-mCnc: 123 mg/dL
Hgb A1c MFr Bld: 5.9 % — ABNORMAL HIGH (ref 4.8–5.6)

## 2021-03-05 NOTE — Progress Notes (Addendum)
  Subjective:  Patient ID: Connie Morales, female    DOB: 1986-01-02,  MRN: [id number]  Chief Complaint  Patient presents with   Foot Pain    "I twisted my left ankle back in October.  Since then, I've noticed stiffness.  It's intermitten pain.  If I do extensivwe cardio or walking, it aggravates it.  I have used ice, ace bandages, and Tylenol to treat it."    35 y.o. female presents with the above complaint. History confirmed with patient.   Objective:  Physical Exam: warm, good capillary refill, no trophic changes or ulcerative lesions, normal DP and PT pulses, and normal sensory exam.  Left foot: She has pain over the ATFL and CFL, no gross instability, no medial ankle pain, no proximal fibular pain   Radiographs: Multiple views x-ray of left ankle: no fracture, dislocation, swelling or degenerative changes noted Assessment:   1. Severe ankle sprain, left, sequela      Plan:  Patient was evaluated and treated and all questions answered.  Recommend physical therapy and bracing with lace up ankle brace.  We did not have any in stock today and she will get 1 of these off [employer].  She is previously done PT in [place] for 11 know exactly where she did this so I can send a referral.  Home rehab exercise also dispensed.  If not improving will consider MRI  Return in about 1 month (around 04/03/2021) for check ankle sprain left after PT.

## 2021-03-11 ENCOUNTER — Telehealth: Payer: Self-pay

## 2021-03-11 NOTE — Addendum Note (Signed)
Addended byLilian Kapur, Damon Baisch R on: 03/11/2021 01:17 PM   Modules accepted: Orders

## 2021-03-11 NOTE — Telephone Encounter (Signed)
Pt would like a referral to [facility] to [facility] in [place].

## 2021-03-11 NOTE — Telephone Encounter (Signed)
Order is attached to last encounter if you can fax to them. Thanks

## 2021-03-12 NOTE — Telephone Encounter (Signed)
DONE

## 2021-04-08 ENCOUNTER — Ambulatory Visit: Payer: BC Managed Care – PPO | Admitting: Podiatry

## 2021-04-09 ENCOUNTER — Telehealth: Payer: Self-pay | Admitting: Podiatry

## 2021-04-09 NOTE — Telephone Encounter (Signed)
Patient called today verifying when her appt was and to possibly reschedule. Pt appt was yesterday. Patient states she is waiting for someone from our office to call her about having Physical Therapy. Patient states she hasn't heard anything about it. Pts number is [phone].

## 2021-05-14 DIAGNOSIS — Z419 Encounter for procedure for purposes other than remedying health state, unspecified: Secondary | ICD-10-CM | POA: Diagnosis not present

## 2021-06-03 ENCOUNTER — Encounter: Payer: Self-pay | Admitting: Podiatry

## 2021-06-03 ENCOUNTER — Encounter (INDEPENDENT_AMBULATORY_CARE_PROVIDER_SITE_OTHER): Payer: Self-pay

## 2021-06-03 ENCOUNTER — Other Ambulatory Visit: Payer: Self-pay

## 2021-06-03 ENCOUNTER — Ambulatory Visit: Payer: Medicaid Other | Admitting: Podiatry

## 2021-06-03 DIAGNOSIS — M25872 Other specified joint disorders, left ankle and foot: Secondary | ICD-10-CM

## 2021-06-03 DIAGNOSIS — S93402S Sprain of unspecified ligament of left ankle, sequela: Secondary | ICD-10-CM

## 2021-06-03 DIAGNOSIS — M25372 Other instability, left ankle: Secondary | ICD-10-CM | POA: Diagnosis not present

## 2021-06-03 DIAGNOSIS — S93401S Sprain of unspecified ligament of right ankle, sequela: Secondary | ICD-10-CM

## 2021-06-04 NOTE — Progress Notes (Signed)
  Subjective:  Patient ID: Connie Morales, female    DOB: 25-Jun-1986,  MRN: [id number]  Chief Complaint  Patient presents with   Ankle Pain    "Im still having pain with my left ankle.  It hurts inside my ankle and I never went to physical therapy.  The lace up brace only irritates it"    35 y.o. female presents with the above complaint. History confirmed with patient.   Objective:  Physical Exam: warm, good capillary refill, no trophic changes or ulcerative lesions, normal DP and PT pulses, and normal sensory exam.  Left foot: She has pain over the ATFL and CFL, no gross instability, no medial ankle pain, no proximal fibular pain, she does have some pain over the anterior ankle joint with dorsiflexion and compression   Radiographs: Multiple views x-ray of left ankle: no fracture, dislocation, swelling or degenerative changes noted Assessment:   1. Severe ankle sprain, left, sequela   2. Instability of left ankle joint   3. Ankle impingement syndrome, left      Plan:  Patient was evaluated and treated and all questions answered.  Still has not had improvement.  Its been nearly 1 year since she originally injured her ankle.  She does not improve despite immobilization bracing has not been successful, she has been doing home exercises without success.  I recommend formal physical therapy at this point and I am sending her referral to [facility].  Since this is been going on for almost a year despite conservative treatment I am ordering an MRI to evaluate for any ankle impingement instability or incongruity of the lateral ankle ligaments.  She will follow-up after the MRI or in 6 weeks whichever comes first for evaluation and review of the study  Return in about 6 weeks (around 07/15/2021) for after PT and MRI to review.

## 2021-06-13 DIAGNOSIS — Z419 Encounter for procedure for purposes other than remedying health state, unspecified: Secondary | ICD-10-CM | POA: Diagnosis not present

## 2021-06-27 ENCOUNTER — Other Ambulatory Visit: Payer: Self-pay

## 2021-06-27 ENCOUNTER — Ambulatory Visit
Admission: RE | Admit: 2021-06-27 | Discharge: 2021-06-27 | Disposition: A | Discharge status: Home / Self Care | Payer: Medicaid Other | Source: Ambulatory Visit | Attending: Podiatry | Admitting: Podiatry

## 2021-06-27 DIAGNOSIS — M25372 Other instability, left ankle: Secondary | ICD-10-CM

## 2021-06-27 DIAGNOSIS — M25872 Other specified joint disorders, left ankle and foot: Secondary | ICD-10-CM

## 2021-06-27 DIAGNOSIS — M25472 Effusion, left ankle: Secondary | ICD-10-CM | POA: Diagnosis not present

## 2021-06-27 DIAGNOSIS — M25572 Pain in left ankle and joints of left foot: Secondary | ICD-10-CM | POA: Diagnosis not present

## 2021-06-27 DIAGNOSIS — M898X7 Other specified disorders of bone, ankle and foot: Secondary | ICD-10-CM | POA: Diagnosis not present

## 2021-06-27 DIAGNOSIS — M7989 Other specified soft tissue disorders: Secondary | ICD-10-CM | POA: Diagnosis not present

## 2021-06-27 DIAGNOSIS — S93402S Sprain of unspecified ligament of left ankle, sequela: Secondary | ICD-10-CM

## 2021-07-14 DIAGNOSIS — Z419 Encounter for procedure for purposes other than remedying health state, unspecified: Secondary | ICD-10-CM | POA: Diagnosis not present

## 2021-07-15 ENCOUNTER — Ambulatory Visit: Payer: Medicaid Other | Admitting: Podiatry

## 2021-07-15 ENCOUNTER — Other Ambulatory Visit: Payer: Self-pay

## 2021-07-15 DIAGNOSIS — M958 Other specified acquired deformities of musculoskeletal system: Secondary | ICD-10-CM | POA: Diagnosis not present

## 2021-07-19 NOTE — Progress Notes (Signed)
  Subjective:  Patient ID: Connie Morales, female    DOB: 30-Mar-1986,  MRN: [id number]  Chief Complaint  Patient presents with   Tendonitis    MRI results left ankle    35 y.o. female presents with the above complaint. History confirmed with patient.  She completed the MRI.  Her ankle is feeling quite a bit better than it was, therapy is helpful  Objective:  Physical Exam: warm, good capillary refill, no trophic changes or ulcerative lesions, normal DP and PT pulses, and normal sensory exam.  Left foot: Minimal to no lateral ankle pain today, no gross instability.  Pain over the anterior joint line.   Radiographs: Multiple views x-ray of left ankle: no fracture, dislocation, swelling or degenerative changes noted  MRI left ankle 06/29/2021 IMPRESSION: 1. 10 x 5 x 4 mm unstable osteochondral lesion of the medial talar dome.     Electronically Signed   By: Obie Dredge M.D.   On: 06/29/2021 20:25 Assessment:   1. Osteochondral defect of ankle       Plan:  Patient was evaluated and treated and all questions answered.  I reviewed the MRI findings and images with the patient.  I discussed the nature of osteochondral lesions.  The lesion is in a relatively unstable area and currently she seems to have had some improvement.  I recommend we continue therapy for now and see if she can heal this on her own until more definitive procedure is necessary at some point in her lifetime.  She will continue therapy and bracing as needed.  If not improving would consider surgical intervention.  Return in about 3 months (around 10/15/2021) for recheck left ankle .

## 2021-07-24 DIAGNOSIS — J0140 Acute pansinusitis, unspecified: Secondary | ICD-10-CM | POA: Diagnosis not present

## 2021-08-13 DIAGNOSIS — Z419 Encounter for procedure for purposes other than remedying health state, unspecified: Secondary | ICD-10-CM | POA: Diagnosis not present

## 2021-09-13 DIAGNOSIS — Z419 Encounter for procedure for purposes other than remedying health state, unspecified: Secondary | ICD-10-CM | POA: Diagnosis not present

## 2021-10-14 DIAGNOSIS — Z419 Encounter for procedure for purposes other than remedying health state, unspecified: Secondary | ICD-10-CM | POA: Diagnosis not present

## 2021-10-19 ENCOUNTER — Ambulatory Visit: Payer: Medicaid Other | Admitting: Podiatry

## 2021-10-19 ENCOUNTER — Other Ambulatory Visit: Payer: Self-pay

## 2021-10-19 ENCOUNTER — Encounter: Payer: Self-pay | Admitting: Podiatry

## 2021-10-19 DIAGNOSIS — M25872 Other specified joint disorders, left ankle and foot: Secondary | ICD-10-CM | POA: Diagnosis not present

## 2021-10-19 DIAGNOSIS — M958 Other specified acquired deformities of musculoskeletal system: Secondary | ICD-10-CM

## 2021-10-20 ENCOUNTER — Telehealth: Payer: Self-pay

## 2021-10-20 NOTE — Progress Notes (Signed)
°  Subjective:  Patient ID: Connie Morales, female    DOB: 04-01-1986,  MRN: [id number]  Chief Complaint  Patient presents with   Foot Pain    "It's fine, just get a lot of stiffness."    36 y.o. female presents with the above complaint. History confirmed with patient.  Since last visit she has been continuing physical therapy at home.  She has had some improvement in pain but still feels very stiff especially when she starts moving at the beginning of the day.  Objective:  Physical Exam: warm, good capillary refill, no trophic changes or ulcerative lesions, normal DP and PT pulses, and normal sensory exam.  Left foot: Minimal to no lateral ankle pain today, no gross instability.  Pain over the anterior joint line.  Limited range of motion in the anterior ankle joint with impingement type sensations   Radiographs: Multiple views x-ray of left ankle: no fracture, dislocation, swelling or degenerative changes noted  MRI left ankle 06/29/2021 IMPRESSION: 1. 10 x 5 x 4 mm unstable osteochondral lesion of the medial talar dome.     Electronically Signed   By: Obie Dredge M.D.   On: 06/29/2021 20:25 Assessment:   1. Osteochondral defect of ankle   2. Ankle impingement syndrome, left       Plan:  Patient was evaluated and treated and all questions answered.  Continues to be quite problematic for her.  Its been over 6 months since we initiated treatment for this.  She has had some improvement in her pain but still has discomfort and quite bit of stiffness as well.  The stiffness has gotten worse.  Most of this appears to be in the front of the ankle joint.  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  We discussed further treatment for this including surgical treatment.  I discussed arthroscopy of the ankle with extensive debridement of the possible impinging structures and diagnostic arthroscopy as well of the joint itself.  This would give [place] a better picture of what  the status of the osteochondral lesion noted on her MRI in October now looks like if it has had any interval healing.  We discussed the risk benefits and potential complications of the procedure including but not limited to pain, swelling, infection, scar, numbness which may be temporary or permanent, chronic pain, stiffness, nerve pain or damage, wound healing problems.  She understands and wishes to proceed.  Informed consent was signed and reviewed.  All questions were addressed.  No guarantees were given.   Surgical plan:  Procedure: -Left ankle arthroscopy  Location: -[facility]  Anesthesia plan: -Regional block and IV sedation  Postoperative pain plan: - Tylenol 1000 mg every 6 hours, ibuprofen 600 mg every 6 hours, gabapentin 300 mg every 8 hours x5 days, oxycodone 5 mg 1-2 tabs every 6 hours only as needed  DVT prophylaxis: -None required  WB Restrictions / DME needs: -WBAT in CAM boot which we will dispense at the surgical center    No follow-ups on file.

## 2021-10-20 NOTE — Telephone Encounter (Signed)
Received surgery paperwork from the [place] office. Called and left a message for Latrecia to call and schedule surgery.

## 2021-11-11 DIAGNOSIS — Z419 Encounter for procedure for purposes other than remedying health state, unspecified: Secondary | ICD-10-CM | POA: Diagnosis not present

## 2021-12-12 DIAGNOSIS — Z419 Encounter for procedure for purposes other than remedying health state, unspecified: Secondary | ICD-10-CM | POA: Diagnosis not present

## 2021-12-17 ENCOUNTER — Encounter: Payer: Self-pay | Admitting: Obstetrics and Gynecology

## 2021-12-17 ENCOUNTER — Ambulatory Visit (INDEPENDENT_AMBULATORY_CARE_PROVIDER_SITE_OTHER): Payer: Medicaid Other | Admitting: Obstetrics and Gynecology

## 2021-12-17 VITALS — BP 112/68 | HR 69 | Resp 16 | Ht 62.0 in | Wt 201.7 lb

## 2021-12-17 DIAGNOSIS — N938 Other specified abnormal uterine and vaginal bleeding: Secondary | ICD-10-CM | POA: Diagnosis not present

## 2021-12-17 DIAGNOSIS — N979 Female infertility, unspecified: Secondary | ICD-10-CM | POA: Diagnosis not present

## 2021-12-17 DIAGNOSIS — Z842 Family history of other diseases of the genitourinary system: Secondary | ICD-10-CM | POA: Diagnosis not present

## 2021-12-17 NOTE — Progress Notes (Signed)
? ? ?  GYNECOLOGY PROGRESS NOTE ? ?Subjective:  ? ? Patient ID: Connie Morales, female    DOB: 12/03/1985, 36 y.o.   MRN: [id number] ? ?HPI ? Patient is a 36 y.o. [name] female who presents for irregular menstrual bleeding for the past 2 months.  Cycles went from 28 days to 26 days, and has had breakthrough bleeding in between. Cycles have become heavier, and now lasting 6-8 days, previously lasting no longer than 5 days.  ? ?Of note, patient still is attempting conception, has been for over 1 year. Has been tracking cycles. Notes family history of DM and PCOS. Last pregnancy was almost 3 years ago, same partner. ? ? ? ?The following portions of the patient's history were reviewed and updated as appropriate: allergies, current medications, past family history, past medical history, past social history, past surgical history, and problem list. ? ?Review of Systems ?Pertinent items noted in HPI and remainder of comprehensive ROS otherwise negative.  ? ?Objective:  ? Blood pressure 112/68, pulse 69, resp. rate 16, height 5\' 2"  (1.575 m), weight 201 lb 11.2 oz (91.5 kg), currently breastfeeding. ? Body mass index is 36.89 kg/m?. ?General appearance: alert and no distress ?Abdomen: soft, non-tender; bowel sounds normal; no masses,  no organomegaly ?Pelvic: deferred ? ? ? ?Assessment:  ? ?1. DUB (dysfunctional uterine bleeding)   ?2. 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  ? ?Plan:  ? ?1. DUB (dysfunctional uterine bleeding) ?- PELVIC COMPLETE WITH TRANSVAGINAL; Future ?- PCOS Diagnostic Profile ?- Offered medication for management of cycles (Tranexamic acid), however patient notes cycles are still manageable.  ?- Patient does note that her teenage niece recently moved in with her. Advised that possibly their cycles are attempting to sync which is leading to some irregularities.  ?- Will defer exam until next visit.  ? ?2. Family history of PCOS ?- US PELVIC COMPLETE WITH TRANSVAGINAL; Future ?-  PCOS Diagnostic Profile ? 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  ? ?RTC in 2-3 weeks to review ultrasound and labs.  ? ? ?A total of 20 minutes were spent face-to-face with the patient during this encounter and over half of that time involved review of previous encounters, counseling, and coordination of care. ? ?44, MD ?[facility] ? ?

## 2021-12-28 ENCOUNTER — Other Ambulatory Visit: Payer: Medicaid Other

## 2022-01-01 ENCOUNTER — Other Ambulatory Visit: Payer: Medicaid Other

## 2022-01-01 ENCOUNTER — Ambulatory Visit (INDEPENDENT_AMBULATORY_CARE_PROVIDER_SITE_OTHER): Payer: Medicaid Other

## 2022-01-01 DIAGNOSIS — N938 Other specified abnormal uterine and vaginal bleeding: Secondary | ICD-10-CM | POA: Diagnosis not present

## 2022-01-01 DIAGNOSIS — N979 Female infertility, unspecified: Secondary | ICD-10-CM

## 2022-01-01 DIAGNOSIS — Z842 Family history of other diseases of the genitourinary system: Secondary | ICD-10-CM | POA: Diagnosis not present

## 2022-01-05 ENCOUNTER — Encounter: Payer: Medicaid Other | Admitting: Obstetrics and Gynecology

## 2022-01-08 LAB — PCOS DIAGNOSTIC PROFILE
17-Alpha-Hydroxyprogesterone: 28 ng/dL
ANTI-MULLERIAN HORMONE (AMH): 3.3 ng/mL
DHEA-Sulfate, LCMS: 64 ug/dL
Estradiol, Serum, MS: 40 pg/mL
Follicle Stimulating Hormone: 10 m[IU]/mL
Free Testosterone, Serum: 1.8 pg/mL
Luteinizing Hormone (LH) ECL: 5.3 m[IU]/mL
Prolactin: 17.9 ng/mL
Sex Hormone Binding Globulin: 47.2 nmol/L
TSH: 2.6 uU/mL
Testosterone, Serum (Total): 16 ng/dL
Testosterone-% Free: 1.1 %

## 2022-01-11 DIAGNOSIS — Z419 Encounter for procedure for purposes other than remedying health state, unspecified: Secondary | ICD-10-CM | POA: Diagnosis not present

## 2022-01-11 NOTE — Progress Notes (Signed)
? ? ?GYNECOLOGY PROGRESS NOTE ? ?Subjective:  ? ? Patient ID: Connie Morales, female    DOB: 24-Oct-1985, 36 y.o.   MRN: [id number] ? ?HPI ? Patient is a 36 y.o. G58P1011 female who presents to review ultrasound and lab results from dysfunctional uterine bleeding. Also with secondary infertility. Has been trying to conceive for the past 2 years. Notes her last cycle started 3 days late.  ? ? ?The following portions of the patient's history were reviewed and updated as appropriate: allergies, current medications, past family history, past medical history, past social history, past surgical history, and problem list. ? ?Review of Systems ?Pertinent items noted in HPI and remainder of comprehensive ROS otherwise negative.  ? ?Objective:  ? Blood pressure 114/78, pulse 68, resp. rate 16, height 5\' 2"  (1.575 m), weight 198 lb 12.8 oz (90.2 kg), currently breastfeeding.  Body mass index is 36.36 kg/m?. ? ?General appearance: alert and no distress ?Remainder of exam deferred.  ? ? ? ?Labs:  ?Results for orders placed or performed in visit on 12/17/21  ?PCOS Diagnostic Profile  ?Result Value Ref Range  ? TSH 2.6 uU/mL  ? Testosterone, Serum (Total) 16 ng/dL  ? Testosterone-% Free 1.1 %  ? Free Testosterone, Serum 1.8 pg/mL  ? Luteinizing Hormone (LH) ECL 5.3 mIU/mL  ? Follicle Stimulating Hormone 10 mIU/mL  ? Prolactin 17.9 ng/mL  ? 17-Alpha-Hydroxyprogesterone 28 ng/dL  ? DHEA-Sulfate, LCMS 64 ug/dL  ? Sex Hormone Binding Globulin 47.2 nmol/L  ? Estradiol, Serum, MS 40 pg/mL  ? ANTI-MULLERIAN HORMONE (AMH) 3.30 ng/mL  ? ? ? ? ?Imaging:  ?US PELVIC COMPLETE WITH TRANSVAGINAL ?Patient Name: Connie Morales ?DOB: Apr 17, 1986 ?MRN: [id number] ?ULTRASOUND REPORT ? ?Location: [facility]  ?Date of Service: 01/01/2022  ? ?Indications:Abnormal Uterine Bleeding; Infertility; Fam hx PCOS;  ? ?Findings:  ?The uterus is anteverted and measures 8.9 x 5.2 x 4.0 cm. ?Echo texture is heterogenous without evidence of focal  masses. ? ?The Endometrium measures 6.5 mm. ? ?Right Ovary measures 3.1 x 1.4 x 2.4  cm. It is normal in appearance. ?Left Ovary measures 3.4 x 1.9 x 2.4 cm. It is normal in appearance. ?Survey of the adnexa demonstrates no adnexal masses. ?There is no free fluid in the cul de sac. ? ?Impression: ?1. Normal pelvic ultrasound. ? ?Recommendations: ?1.Clinical correlation with the patient's History and Physical Exam. ? ?Vivien Rota  Henderson-Gainey  ? ?I have reviewed this study and agree with documented findings.  ? ?Rubie Maid, MD ?[facility] ? ? ? ? ?Assessment:  ? ?1. Infertility management   ?2. Family history of PCOS   ?3. DUB (dysfunctional uterine bleeding)   ?  ? ?Plan:  ? ?Reviewed labs and ultrasound with patient.  No obvious evidence of PCOS by ultrasound or labs.  Patient may have anovulatory cycles as the cause of her infertility and often irregular menses.  Also discussed that as no abnormalities were found with she may need to consider partner testing with semen analysis.  Patient notes that she has tried to encourage him to do so however with work schedule he has been unable to drop off a specimen.  Advised that this is pertinent to ensure no other causes of her infertility.  We will also get a 21 progesterone level to see if patient does ovulate.  Also suggested use of ovulation kits at home, but patient would prefer blood draw. ?Once patient has her partner complete semen analysis can consider use of ovulatory medications such as  Clomid to assist with fertility. ?Patient with history of prediabetes, will reassess levels with next blood draw. ? ? ?A total of 25 minutes were spent face-to-face with the patient during this encounter and over half of that time involved counseling and coordination of care. ? ?Rubie Maid, MD ?[facility] ? ?

## 2022-01-12 ENCOUNTER — Encounter: Payer: Self-pay | Admitting: Obstetrics and Gynecology

## 2022-01-12 ENCOUNTER — Ambulatory Visit (INDEPENDENT_AMBULATORY_CARE_PROVIDER_SITE_OTHER): Payer: Medicaid Other | Admitting: Obstetrics and Gynecology

## 2022-01-12 VITALS — BP 114/78 | HR 68 | Resp 16 | Ht 62.0 in | Wt 198.8 lb

## 2022-01-12 DIAGNOSIS — Z319 Encounter for procreative management, unspecified: Secondary | ICD-10-CM | POA: Diagnosis not present

## 2022-01-12 DIAGNOSIS — Z842 Family history of other diseases of the genitourinary system: Secondary | ICD-10-CM

## 2022-01-12 DIAGNOSIS — R7303 Prediabetes: Secondary | ICD-10-CM | POA: Diagnosis not present

## 2022-01-12 DIAGNOSIS — N938 Other specified abnormal uterine and vaginal bleeding: Secondary | ICD-10-CM

## 2022-01-16 DIAGNOSIS — J069 Acute upper respiratory infection, unspecified: Secondary | ICD-10-CM | POA: Diagnosis not present

## 2022-01-19 ENCOUNTER — Other Ambulatory Visit: Payer: Medicaid Other

## 2022-01-19 DIAGNOSIS — Z319 Encounter for procreative management, unspecified: Secondary | ICD-10-CM

## 2022-01-19 DIAGNOSIS — R7303 Prediabetes: Secondary | ICD-10-CM | POA: Diagnosis not present

## 2022-01-19 DIAGNOSIS — Z842 Family history of other diseases of the genitourinary system: Secondary | ICD-10-CM

## 2022-01-20 LAB — PROGESTERONE: Progesterone: 12.3 ng/mL

## 2022-01-20 LAB — HEMOGLOBIN A1C
Est. average glucose Bld gHb Est-mCnc: 123 mg/dL
Hgb A1c MFr Bld: 5.9 % — ABNORMAL HIGH (ref 4.8–5.6)

## 2022-01-21 ENCOUNTER — Encounter: Payer: Self-pay | Admitting: Obstetrics and Gynecology

## 2022-02-11 DIAGNOSIS — Z419 Encounter for procedure for purposes other than remedying health state, unspecified: Secondary | ICD-10-CM | POA: Diagnosis not present

## 2022-03-05 ENCOUNTER — Encounter: Payer: BC Managed Care – PPO | Admitting: Obstetrics and Gynecology

## 2022-03-13 DIAGNOSIS — Z419 Encounter for procedure for purposes other than remedying health state, unspecified: Secondary | ICD-10-CM | POA: Diagnosis not present

## 2022-03-25 ENCOUNTER — Encounter: Payer: Medicaid Other | Admitting: Obstetrics and Gynecology

## 2022-04-13 DIAGNOSIS — Z419 Encounter for procedure for purposes other than remedying health state, unspecified: Secondary | ICD-10-CM | POA: Diagnosis not present

## 2022-04-26 DIAGNOSIS — F4323 Adjustment disorder with mixed anxiety and depressed mood: Secondary | ICD-10-CM | POA: Diagnosis not present

## 2022-05-12 ENCOUNTER — Other Ambulatory Visit (HOSPITAL_COMMUNITY)
Admission: RE | Admit: 2022-05-12 | Discharge: 2022-05-12 | Disposition: A | Discharge status: Home / Self Care | Payer: Medicaid Other | Source: Ambulatory Visit | Attending: Obstetrics and Gynecology | Admitting: Obstetrics and Gynecology

## 2022-05-12 ENCOUNTER — Encounter: Payer: Self-pay | Admitting: Obstetrics and Gynecology

## 2022-05-12 ENCOUNTER — Ambulatory Visit (INDEPENDENT_AMBULATORY_CARE_PROVIDER_SITE_OTHER): Payer: Medicaid Other | Admitting: Obstetrics and Gynecology

## 2022-05-12 VITALS — BP 112/69 | HR 68 | Ht 62.0 in | Wt 203.0 lb

## 2022-05-12 DIAGNOSIS — Z01419 Encounter for gynecological examination (general) (routine) without abnormal findings: Secondary | ICD-10-CM | POA: Insufficient documentation

## 2022-05-12 DIAGNOSIS — E669 Obesity, unspecified: Secondary | ICD-10-CM | POA: Diagnosis not present

## 2022-05-12 DIAGNOSIS — R7303 Prediabetes: Secondary | ICD-10-CM

## 2022-05-12 DIAGNOSIS — Z124 Encounter for screening for malignant neoplasm of cervix: Secondary | ICD-10-CM

## 2022-05-12 DIAGNOSIS — N979 Female infertility, unspecified: Secondary | ICD-10-CM

## 2022-05-12 NOTE — Progress Notes (Signed)
Patient presents for annual exam, denies complaints today.    Loney Laurence, CMA [facility]

## 2022-05-12 NOTE — Progress Notes (Signed)
GYNECOLOGY ANNUAL PHYSICAL EXAM PROGRESS NOTE  Subjective:    Connie Morales is a 36 y.o. G43P1011 female who presents for an annual exam. The patient has no complaints today. The patient is sexually active. The patient participates in regular exercise: no. Has the patient ever been transfused or tattooed?: no. The patient reports that there is not domestic violence in her life.   At last visit several months ago, patient was interested in conception.  Patient underwent work-up for fertility, but notes that her significant other has not performed semen analysis and does not plan to.  Patient reports that she is no longer interested in actively attempting conception, however will continue to engage in unprotected sex and if pregnancy occurs she is okay with this.   Menstrual History: Menarche age: 73 Patient's last menstrual period was 04/20/2022 (exact date). Period Duration (Days): 6 Period Pattern: (!) Irregular Menstrual Flow: Moderate, Light, Heavy Menstrual Control: Maxi pad, Other (Comment) (divacup) Menstrual Control Change Freq (Hours): 4   Gynecologic History:  Contraception: none History of STI's: Denies Last Pap: 09/28/2018. Results were: normal.  Denies h/o abnormal pap smears.    Upstream - 05/12/22 1954       Pregnancy Intention Screening   Does the patient want to become pregnant in the next year? Yes    Does the patient's partner want to become pregnant in the next year? Yes    Would the patient like to discuss contraceptive options today? No      Contraception Wrap Up   Current Method Pregnant/Seeking Pregnancy    End Method Pregnant/Seeking Pregnancy    Contraception Counseling Provided No            The pregnancy intention screening data noted above was reviewed. Potential methods of contraception were discussed. The patient elected to proceed with Pregnant/Seeking Pregnancy.    OB History  Gravida Para Term Preterm AB Living  2 1 1  0 1 1  SAB  IAB Ectopic Multiple Live Births  0 1 0 0 1    # Outcome Date GA Lbr Len/2nd Weight Sex Delivery Anes PTL Lv  2 Term 04/04/19 [redacted]w[redacted]d   M CS-Unspec   LIV  1 IAB             Past Medical History:  Diagnosis Date   Anxiety    Asthma     Past Surgical History:  Procedure Laterality Date   CESAREAN SECTION N/A 04/04/2019   Procedure: CESAREAN SECTION;  Surgeon: 04/06/2019, MD;  Location: [facility];  Service: Obstetrics;  Laterality: N/A;   WISDOM TOOTH EXTRACTION      Family History  Problem Relation Age of Onset   Diabetes Mother    Heart failure Mother    Hypertension Father    Kidney cancer Father    Polycystic ovary syndrome Sister    Thalassemia Sister    Heart failure Maternal Grandmother    Hypertension Maternal Grandmother    Anxiety disorder Maternal Grandmother    Anemia Maternal Grandmother    Thalassemia Maternal Grandmother    Heart failure Maternal Grandfather    Healthy Paternal Grandmother     Social History   Socioeconomic History   Marital status: Unknown    Spouse name: Not on file   Number of children: Not on file   Years of education: Not on file   Highest education level: Not on file  Occupational History   Not on file  Tobacco Use   Smoking status:  Never   Smokeless tobacco: Never  Vaping Use   Vaping Use: Former  Substance and Sexual Activity   Alcohol use: Yes    Comment: SOCIALLY   Drug use: Never   Sexual activity: Yes    Birth control/protection: None  Other Topics Concern   Not on file  Social History Narrative   Not on file   Social Determinants of Health   Financial Resource Strain: Not on file  Food Insecurity: Not on file  Transportation Needs: Not on file  Physical Activity: Not on file  Stress: Not on file  Social Connections: Not on file  Intimate Partner Violence: Not on file    Current Outpatient Medications on File Prior to Visit  Medication Sig Dispense Refill   acetaminophen (TYLENOL) 500 MG tablet  Take 2 tablets (1,000 mg total) by mouth every 6 (six) hours as needed. 30 tablet 0   albuterol (PROVENTIL) (2.5 MG/3ML) 0.083% nebulizer solution Take 2.5 mg by nebulization every 6 (six) hours as needed for wheezing or shortness of breath.     famotidine (PEPCID) 20 MG tablet Take 20 mg by mouth 2 (two) times daily.     ibuprofen (ADVIL) 600 MG tablet Take 1 tablet (600 mg total) by mouth every 6 (six) hours as needed for moderate pain or cramping. 30 tablet 1   No current facility-administered medications on file prior to visit.    Allergies  Allergen Reactions   Fish Allergy Hives   Kiwi Extract Itching    Mouth itching     Review of Systems Constitutional: negative for chills, fatigue, fevers and sweats Eyes: negative for irritation, redness and visual disturbance Ears, nose, mouth, throat, and face: negative for hearing loss, nasal congestion, snoring and tinnitus Respiratory: negative for asthma, cough, sputum Cardiovascular: negative for chest pain, dyspnea, exertional chest pressure/discomfort, irregular heart beat, palpitations and syncope Gastrointestinal: negative for abdominal pain, change in bowel habits, nausea and vomiting Genitourinary: negative for abnormal menstrual periods, genital lesions, sexual problems and vaginal discharge, dysuria and urinary incontinence Integument/breast: negative for breast lump, breast tenderness and nipple discharge Hematologic/lymphatic: negative for bleeding and easy bruising Musculoskeletal:negative for back pain and muscle weakness Neurological: negative for dizziness, headaches, vertigo and weakness Endocrine: negative for diabetic symptoms including polydipsia, polyuria and skin dryness Allergic/Immunologic: negative for hay fever and urticaria      Objective:  Blood pressure 112/69, pulse 68, height 5\' 2"  (1.575 m), weight 203 lb (92.1 kg), last menstrual period 04/20/2022.  Body mass index is 37.13 kg/m.    General  Appearance:    Alert, cooperative, no distress, appears stated age, moderate obesity  Head:    Normocephalic, without obvious abnormality, atraumatic  Eyes:    PERRL, conjunctiva/corneas clear, EOM's intact, both eyes  Ears:    Normal external ear canals, both ears  Nose:   Nares normal, septum midline, mucosa normal, no drainage or sinus tenderness  Throat:   Lips, mucosa, and tongue normal; teeth and gums normal  Neck:   Supple, symmetrical, trachea midline, no adenopathy; thyroid: no enlargement/tenderness/nodules; no carotid bruit or JVD  Back:     Symmetric, no curvature, ROM normal, no CVA tenderness  Lungs:     Clear to auscultation bilaterally, respirations unlabored  Chest Wall:    No tenderness or deformity   Heart:    Regular rate and rhythm, S1 and S2 normal, no murmur, rub or gallop  Breast Exam:    No tenderness, masses, or nipple abnormality  Abdomen:  Soft, non-tender, bowel sounds active all four quadrants, no masses, no organomegaly.    Genitalia:    Pelvic:external genitalia normal, vagina without lesions, discharge, or tenderness, rectovaginal septum  normal. Cervix normal in appearance, no cervical motion tenderness, no adnexal masses or tenderness.  Uterus normal size, shape, mobile, regular contours, nontender.  Rectal:    Normal external sphincter.  No hemorrhoids appreciated. Internal exam not done.   Extremities:   Extremities normal, atraumatic, no cyanosis or edema  Pulses:   2+ and symmetric all extremities  Skin:   Skin color, texture, turgor normal, no rashes or lesions  Lymph nodes:   Cervical, supraclavicular, and axillary nodes normal  Neurologic:   CNII-XII intact, normal strength, sensation and reflexes throughout     Labs:  Lab Results  Component Value Date   WBC 6.1 03/04/2021   HGB 12.9 03/04/2021   HCT 40.3 03/04/2021   MCV 85 03/04/2021   PLT 367 03/04/2021    Lab Results  Component Value Date   CREATININE 0.88 03/04/2021   BUN 11  03/04/2021   NA 142 03/04/2021   K 4.6 03/04/2021   CL 103 03/04/2021   CO2 23 03/04/2021    Lab Results  Component Value Date   ALT 25 03/04/2021   AST 19 03/04/2021   ALKPHOS 96 03/04/2021   BILITOT 0.2 03/04/2021    Lab Results  Component Value Date   TSH 2.860 03/04/2021     Assessment:   1. Well woman exam with routine gynecological exam   2. Prediabetes   3. Cervical cancer screening   4. Obesity (BMI 35.0-39.9 without comorbidity)   5. Female infertility, secondary      Plan:  Blood tests: CBC with diff and Comprehensive metabolic panel. Breast self exam technique reviewed and patient encouraged to perform self-exam monthly. Contraception: none. Is ambivalent if pregnancy occurs. Not proceeding with further fertility management at this time.  Discussed healthy lifestyle modifications. Pap smear ordered. Prediabetes, discussed management with diet and lifestyle, if no improvement may need to consider medication management.  Follow up in 1 year for annual exam   Hildred Laser, MD [facility]

## 2022-05-13 LAB — COMPREHENSIVE METABOLIC PANEL
ALT: 16 IU/L (ref 0–32)
AST: 18 IU/L (ref 0–40)
Albumin/Globulin Ratio: 1.8 (ref 1.2–2.2)
Albumin: 4.4 g/dL (ref 3.9–4.9)
Alkaline Phosphatase: 91 IU/L (ref 44–121)
BUN/Creatinine Ratio: 11 (ref 9–23)
BUN: 12 mg/dL (ref 6–20)
Bilirubin Total: 0.3 mg/dL (ref 0.0–1.2)
CO2: 20 mmol/L (ref 20–29)
Calcium: 9.4 mg/dL (ref 8.7–10.2)
Chloride: 106 mmol/L (ref 96–106)
Creatinine, Ser: 1.07 mg/dL — ABNORMAL HIGH (ref 0.57–1.00)
Globulin, Total: 2.5 g/dL (ref 1.5–4.5)
Glucose: 99 mg/dL (ref 70–99)
Potassium: 4.4 mmol/L (ref 3.5–5.2)
Sodium: 141 mmol/L (ref 134–144)
Total Protein: 6.9 g/dL (ref 6.0–8.5)
eGFR: 70 mL/min/{1.73_m2} (ref >59)

## 2022-05-13 LAB — CBC
Hematocrit: 38.5 % (ref 34.0–46.6)
Hemoglobin: 12.6 g/dL (ref 11.1–15.9)
MCH: 27.2 pg (ref 26.6–33.0)
MCHC: 32.7 g/dL (ref 31.5–35.7)
MCV: 83 fL (ref 79–97)
Platelets: 375 10*3/uL (ref 150–450)
RBC: 4.64 x10E6/uL (ref 3.77–5.28)
RDW: 13.5 % (ref 11.7–15.4)
WBC: 6.5 10*3/uL (ref 3.4–10.8)

## 2022-05-14 DIAGNOSIS — Z419 Encounter for procedure for purposes other than remedying health state, unspecified: Secondary | ICD-10-CM | POA: Diagnosis not present

## 2022-05-19 ENCOUNTER — Telehealth: Payer: Self-pay

## 2022-05-19 DIAGNOSIS — F4323 Adjustment disorder with mixed anxiety and depressed mood: Secondary | ICD-10-CM | POA: Diagnosis not present

## 2022-05-19 NOTE — Telephone Encounter (Signed)
Pt needs a refill on her eczema medications and she uses the [facility]  on [address] [phone] is her call back number she prefers the ointment vs the cream

## 2022-05-20 LAB — CYTOLOGY - PAP
Comment: NEGATIVE
Diagnosis: NEGATIVE
High risk HPV: NEGATIVE

## 2022-05-24 NOTE — Telephone Encounter (Signed)
I don't see this medication in care everywhere. Please advise.

## 2022-05-31 ENCOUNTER — Telehealth: Payer: Self-pay | Admitting: Obstetrics and Gynecology

## 2022-05-31 NOTE — Telephone Encounter (Signed)
Patient called inquiring about RX refill for eczema cream. Informed patient of communication between nurse and provider. Informed patient to have pharmacy send a medication refill request.

## 2022-06-02 DIAGNOSIS — F4323 Adjustment disorder with mixed anxiety and depressed mood: Secondary | ICD-10-CM | POA: Diagnosis not present

## 2022-06-13 DIAGNOSIS — Z419 Encounter for procedure for purposes other than remedying health state, unspecified: Secondary | ICD-10-CM | POA: Diagnosis not present

## 2022-06-16 DIAGNOSIS — F4323 Adjustment disorder with mixed anxiety and depressed mood: Secondary | ICD-10-CM | POA: Diagnosis not present

## 2022-06-30 DIAGNOSIS — F4323 Adjustment disorder with mixed anxiety and depressed mood: Secondary | ICD-10-CM | POA: Diagnosis not present

## 2022-07-05 NOTE — Progress Notes (Signed)
Patient, No Pcp Per   Chief Complaint  Patient presents with   Vaginal Itching    No discharge or odor x 1.5 weeks    HPI:      Ms. Connie Morales is a 36 y.o. [id number] whose LMP was Patient's last menstrual period was 06/13/2022 (approximate)., presents today for vaginal itching and swelling about 1 1/2 wks ago. Sx started after using toilet paper at work ([employer]); no increased d/c or odor with sx. Started using unscented wipes to clean and sx resolved. Pt with hx of eczema and has very sensitive skin. Feels better today, still no d/c, odor. No recent abx use, no meds to treat sx. No urin sx.  She is sex active, no pain/bleeding. No new partners.  Neg pap 8/23  Patient Active Problem List   Diagnosis Date Noted   Breech presentation 04/04/2019   Anxiety 12/17/2016   Routine history and physical examination of adult 12/17/2016   Asthma 10/17/2014    Past Surgical History:  Procedure Laterality Date   CESAREAN SECTION N/A 04/04/2019   Procedure: CESAREAN SECTION;  Surgeon: Thurnell Lose, MD;  Location: [facility];  Service: Obstetrics;  Laterality: N/A;   WISDOM TOOTH EXTRACTION      Family History  Problem Relation Age of Onset   Diabetes Mother    Heart failure Mother    Hypertension Father    Kidney cancer Father    Polycystic ovary syndrome Sister    Thalassemia Sister    Heart failure Maternal Grandmother    Hypertension Maternal Grandmother    Anxiety disorder Maternal Grandmother    Anemia Maternal Grandmother    Thalassemia Maternal Grandmother    Heart failure Maternal Grandfather    Healthy Paternal Grandmother     Social History   Socioeconomic History   Marital status: Unknown    Spouse name: Not on file   Number of children: Not on file   Years of education: Not on file   Highest education level: Not on file  Occupational History   Not on file  Tobacco Use   Smoking status: Never   Smokeless tobacco: Never  Vaping Use   Vaping  Use: Former  Substance and Sexual Activity   Alcohol use: Yes    Comment: SOCIALLY   Drug use: Never   Sexual activity: Yes    Birth control/protection: None  Other Topics Concern   Not on file  Social History Narrative   Not on file   Social Determinants of Health   Financial Resource Strain: Not on file  Food Insecurity: Not on file  Transportation Needs: Not on file  Physical Activity: Not on file  Stress: Not on file  Social Connections: Not on file  Intimate Partner Violence: Not on file    Outpatient Medications Prior to Visit  Medication Sig Dispense Refill   albuterol (PROVENTIL) (2.5 MG/3ML) 0.083% nebulizer solution Take 2.5 mg by nebulization every 6 (six) hours as needed for wheezing or shortness of breath.     famotidine (PEPCID) 20 MG tablet Take 20 mg by mouth 2 (two) times daily.     acetaminophen (TYLENOL) 500 MG tablet Take 2 tablets (1,000 mg total) by mouth every 6 (six) hours as needed. 30 tablet 0   ibuprofen (ADVIL) 600 MG tablet Take 1 tablet (600 mg total) by mouth every 6 (six) hours as needed for moderate pain or cramping. 30 tablet 1   No facility-administered medications prior to visit.  ROS:  Review of Systems  Constitutional:  Negative for fever.  Gastrointestinal:  Negative for blood in stool, constipation, diarrhea, nausea and vomiting.  Genitourinary:  Negative for dyspareunia, dysuria, flank pain, frequency, hematuria, urgency, vaginal bleeding, vaginal discharge and vaginal pain.  Musculoskeletal:  Negative for back pain.  Skin:  Negative for rash.   BREAST: No symptoms   OBJECTIVE:   Vitals:  BP 120/80   Ht 5\' 2"  (1.575 m)   Wt 207 lb (93.9 kg)   LMP 06/13/2022 (Approximate)   BMI 37.86 kg/m   Physical Exam Vitals reviewed.  Constitutional:      Appearance: She is well-developed.  Pulmonary:     Effort: Pulmonary effort is normal.  Genitourinary:    General: Normal vulva.     Pubic Area: No rash.      Labia:         Right: No rash, tenderness or lesion.        Left: No rash, tenderness or lesion.      Vagina: Vaginal discharge present. No erythema or tenderness.     Cervix: Normal.     Uterus: Normal. Not enlarged and not tender.      Adnexa: Right adnexa normal and left adnexa normal.       Right: No mass or tenderness.         Left: No mass or tenderness.    Musculoskeletal:        General: Normal range of motion.     Cervical back: Normal range of motion.  Skin:    General: Skin is warm and dry.  Neurological:     General: No focal deficit present.     Mental Status: She is alert and oriented to person, place, and time.  Psychiatric:        Mood and Affect: Mood normal.        Behavior: Behavior normal.        Thought Content: Thought content normal.        Judgment: Judgment normal.     Results: Results for orders placed or performed in visit on 07/06/22 (from the past 24 hour(s))  POCT Wet Prep with KOH     Status: Abnormal   Collection Time: 07/06/22  5:06 PM  Result Value Ref Range   Trichomonas, UA Negative    Clue Cells Wet Prep HPF POC pos    Epithelial Wet Prep HPF POC     Yeast Wet Prep HPF POC neg    Bacteria Wet Prep HPF POC     RBC Wet Prep HPF POC     WBC Wet Prep HPF POC     KOH Prep POC Positive (A) Negative     Assessment/Plan: BV (bacterial vaginosis) - Plan: metroNIDAZOLE (NUVESSA) 1.3 % GEL, POCT Wet Prep with KOH; pos wet prep. Rx nuvessa, f/u prn.   Vaginal itching - Plan: POCT Wet Prep with KOH; sx resolved after d/c toilet paper at work. Neg exam, neg wet prep for yeast. F/u prn.    Meds ordered this encounter  Medications   metroNIDAZOLE (NUVESSA) 1.3 % GEL    Sig: Place 1 Tube vaginally once for 1 dose.    Dispense:  5 g    Refill:  0    Order Specific Question:   Supervising Provider    Answer:   07/08/22 [[id number]]      Return if symptoms worsen or fail to improve.  Moshe Wenger B. Sulayman Manning, PA-C 07/06/2022 5:06 PM

## 2022-07-06 ENCOUNTER — Encounter: Payer: Self-pay | Admitting: Obstetrics and Gynecology

## 2022-07-06 ENCOUNTER — Ambulatory Visit (INDEPENDENT_AMBULATORY_CARE_PROVIDER_SITE_OTHER): Payer: Medicaid Other | Admitting: Obstetrics and Gynecology

## 2022-07-06 VITALS — BP 120/80 | Ht 62.0 in | Wt 207.0 lb

## 2022-07-06 DIAGNOSIS — B9689 Other specified bacterial agents as the cause of diseases classified elsewhere: Secondary | ICD-10-CM | POA: Diagnosis not present

## 2022-07-06 DIAGNOSIS — N898 Other specified noninflammatory disorders of vagina: Secondary | ICD-10-CM

## 2022-07-06 DIAGNOSIS — N760 Acute vaginitis: Secondary | ICD-10-CM | POA: Diagnosis not present

## 2022-07-06 LAB — POCT WET PREP WITH KOH
Clue Cells Wet Prep HPF POC: POSITIVE
KOH Prep POC: POSITIVE — AB
Trichomonas, UA: NEGATIVE
Yeast Wet Prep HPF POC: NEGATIVE

## 2022-07-06 MED ORDER — NUVESSA 1.3 % VA GEL: 1.0000 | Freq: Once | VAGINAL | 0 refills | Status: AC

## 2022-07-06 NOTE — Patient Instructions (Signed)
I value your feedback and you entrusting us with your care. If you get a [facility] patient survey, I would appreciate you taking the time to let us know about your experience today. Thank you! ? ? ?

## 2022-07-12 DIAGNOSIS — F4323 Adjustment disorder with mixed anxiety and depressed mood: Secondary | ICD-10-CM | POA: Diagnosis not present

## 2022-07-14 DIAGNOSIS — Z419 Encounter for procedure for purposes other than remedying health state, unspecified: Secondary | ICD-10-CM | POA: Diagnosis not present

## 2022-07-26 DIAGNOSIS — F4323 Adjustment disorder with mixed anxiety and depressed mood: Secondary | ICD-10-CM | POA: Diagnosis not present

## 2022-08-13 DIAGNOSIS — Z419 Encounter for procedure for purposes other than remedying health state, unspecified: Secondary | ICD-10-CM | POA: Diagnosis not present

## 2022-08-18 DIAGNOSIS — F4323 Adjustment disorder with mixed anxiety and depressed mood: Secondary | ICD-10-CM | POA: Diagnosis not present

## 2022-08-25 DIAGNOSIS — F4323 Adjustment disorder with mixed anxiety and depressed mood: Secondary | ICD-10-CM | POA: Diagnosis not present

## 2022-09-01 DIAGNOSIS — F4323 Adjustment disorder with mixed anxiety and depressed mood: Secondary | ICD-10-CM | POA: Diagnosis not present

## 2022-09-08 DIAGNOSIS — F4323 Adjustment disorder with mixed anxiety and depressed mood: Secondary | ICD-10-CM | POA: Diagnosis not present

## 2022-09-13 DIAGNOSIS — Z419 Encounter for procedure for purposes other than remedying health state, unspecified: Secondary | ICD-10-CM | POA: Diagnosis not present

## 2022-09-17 ENCOUNTER — Encounter: Payer: Self-pay | Admitting: Obstetrics and Gynecology

## 2022-09-29 DIAGNOSIS — F4323 Adjustment disorder with mixed anxiety and depressed mood: Secondary | ICD-10-CM | POA: Diagnosis not present

## 2022-10-06 DIAGNOSIS — F4323 Adjustment disorder with mixed anxiety and depressed mood: Secondary | ICD-10-CM | POA: Diagnosis not present

## 2022-10-13 DIAGNOSIS — F4323 Adjustment disorder with mixed anxiety and depressed mood: Secondary | ICD-10-CM | POA: Diagnosis not present

## 2022-10-14 DIAGNOSIS — Z419 Encounter for procedure for purposes other than remedying health state, unspecified: Secondary | ICD-10-CM | POA: Diagnosis not present

## 2022-10-20 DIAGNOSIS — F4323 Adjustment disorder with mixed anxiety and depressed mood: Secondary | ICD-10-CM | POA: Diagnosis not present

## 2022-10-27 DIAGNOSIS — F4323 Adjustment disorder with mixed anxiety and depressed mood: Secondary | ICD-10-CM | POA: Diagnosis not present

## 2022-11-09 ENCOUNTER — Telehealth: Payer: Self-pay

## 2022-11-09 DIAGNOSIS — J45901 Unspecified asthma with (acute) exacerbation: Secondary | ICD-10-CM | POA: Diagnosis not present

## 2022-11-09 NOTE — Telephone Encounter (Signed)
Mychart msg sent

## 2022-11-12 DIAGNOSIS — R051 Acute cough: Secondary | ICD-10-CM | POA: Diagnosis not present

## 2022-11-12 DIAGNOSIS — R0981 Nasal congestion: Secondary | ICD-10-CM | POA: Diagnosis not present

## 2022-11-12 DIAGNOSIS — Z419 Encounter for procedure for purposes other than remedying health state, unspecified: Secondary | ICD-10-CM | POA: Diagnosis not present

## 2022-12-13 DIAGNOSIS — Z419 Encounter for procedure for purposes other than remedying health state, unspecified: Secondary | ICD-10-CM | POA: Diagnosis not present

## 2023-01-12 DIAGNOSIS — Z419 Encounter for procedure for purposes other than remedying health state, unspecified: Secondary | ICD-10-CM | POA: Diagnosis not present

## 2023-02-12 DIAGNOSIS — Z419 Encounter for procedure for purposes other than remedying health state, unspecified: Secondary | ICD-10-CM | POA: Diagnosis not present

## 2023-02-21 IMAGING — MR MR ANKLE*L* W/O CM
5 series · 37 of 40 positions shown · non-contrast
Comparison: Left ankle x-rays dated March 05, 2021.

CLINICAL DATA: Chronic left ankle pain. No injury or prior surgery.

EXAM:
MRI OF THE LEFT ANKLE WITHOUT CONTRAST
TECHNIQUE: Multiplanar, multisequence MR imaging of the ankle was performed. No
intravenous contrast was administered.

[Series 4: T2 fat-sat · axial · 3.0mm · 0.50mm/px · z∈[-79,+42]mm · 8 of 32 slices shown (1 of 2)]
[im 1/32]
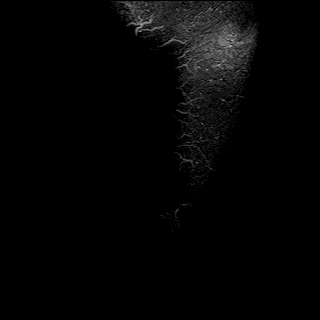
[im 4/32]
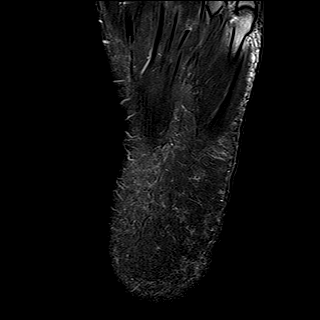
[im 11/32]
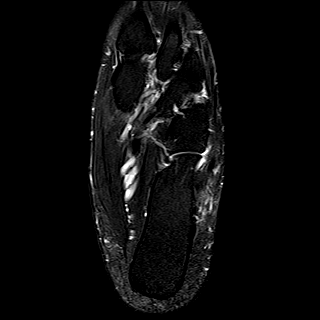
[im 14/32]
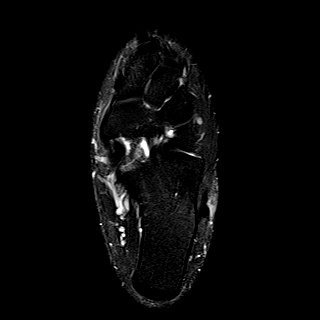
[im 18/32]
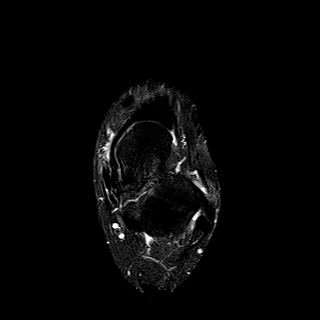
[im 21/32]
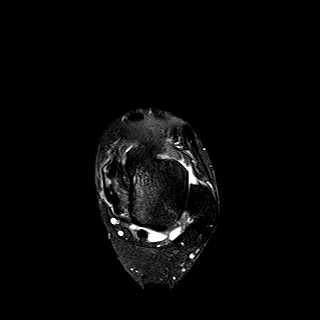
[im 28/32]
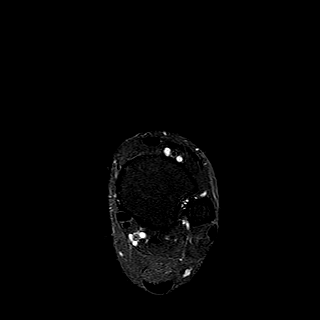
[im 32/32]
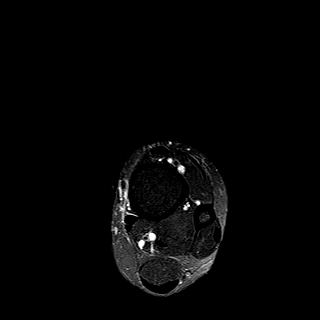

[Series 5: PD fat-sat · axial · 3.0mm · 0.42mm/px · z∈[-79,+42]mm · 9 of 32 slices shown]
[im 1/32]
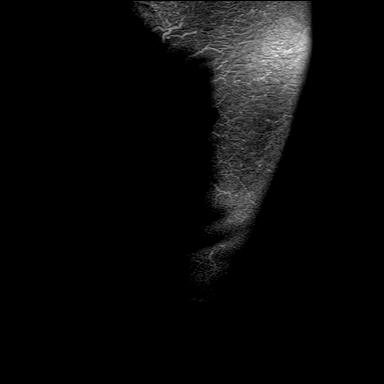
[im 4/32]
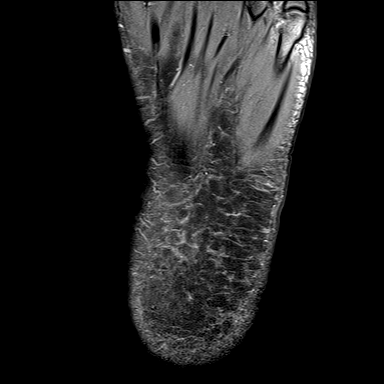
[im 8/32]
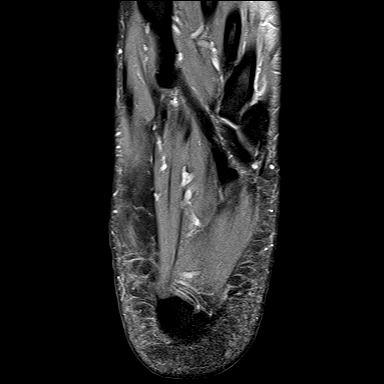
[im 12/32]
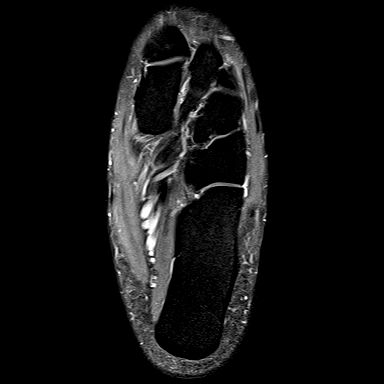
[im 16/32]
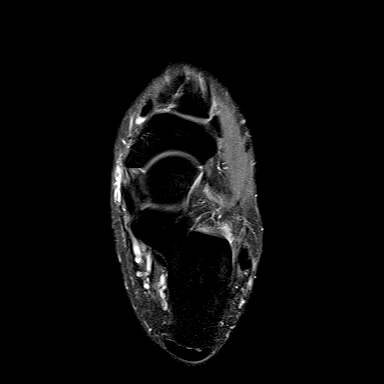
[im 20/32]
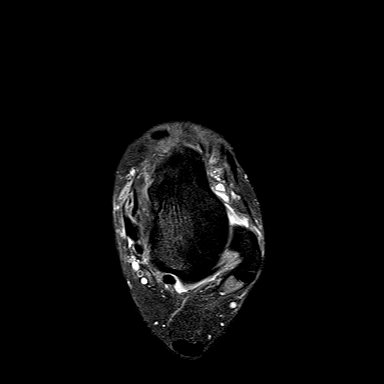
[im 24/32]
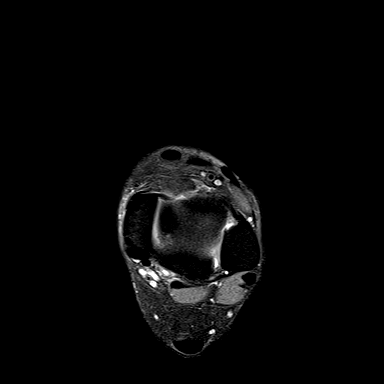
[im 28/32]
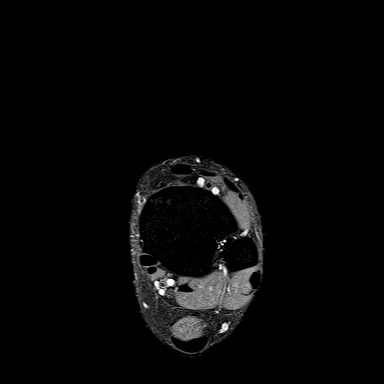
[im 32/32]
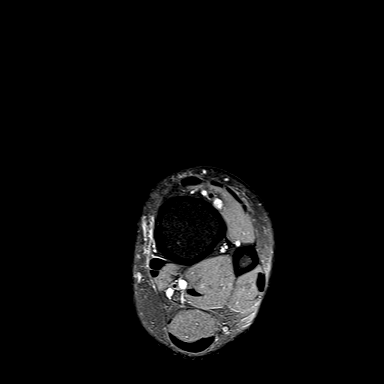

[Series 6: T1 · sagittal · 4.0mm · 0.56mm/px · 6 of 20 slices shown]
[im 1/20]
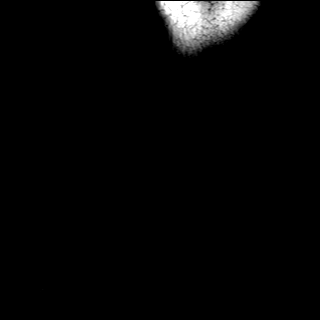
[im 4/20]
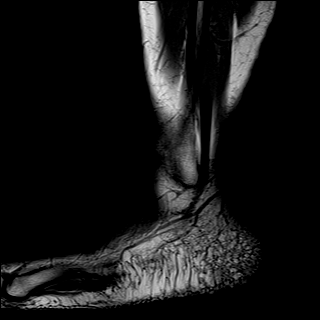
[im 8/20]
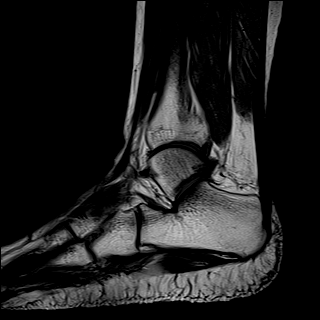
[im 12/20]
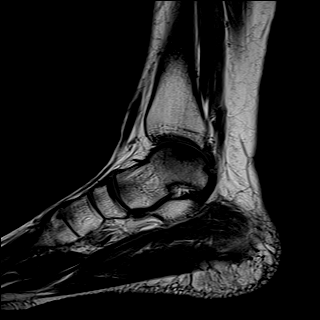
[im 16/20]
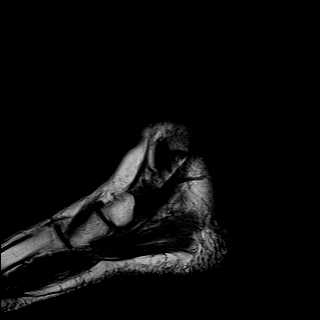
[im 20/20]
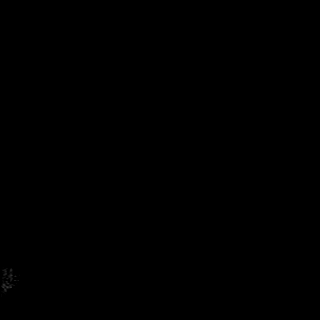

[Series 7: STIR · sagittal · 4.0mm · 0.35mm/px · 5 of 20 slices shown]
[im 1/20]
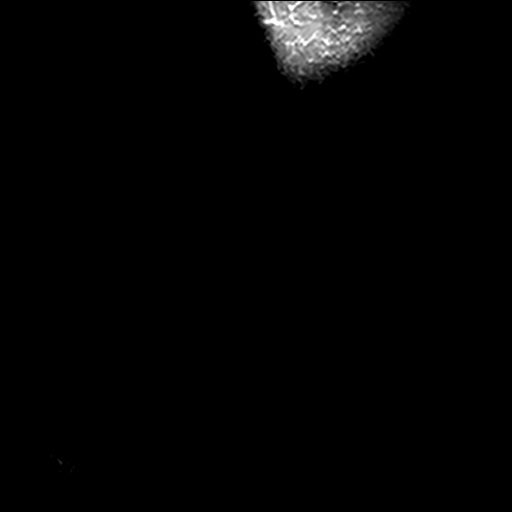
[im 4/20]
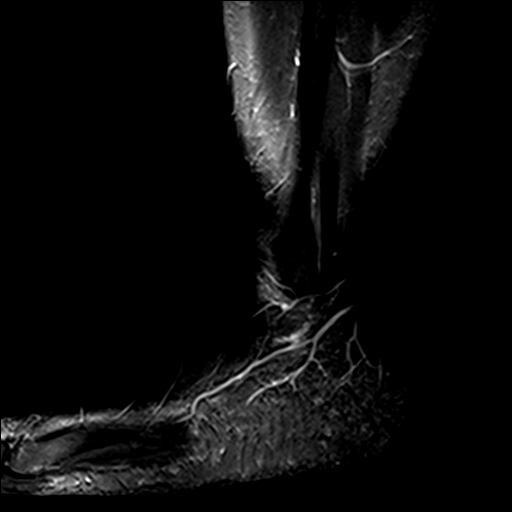
[im 8/20]
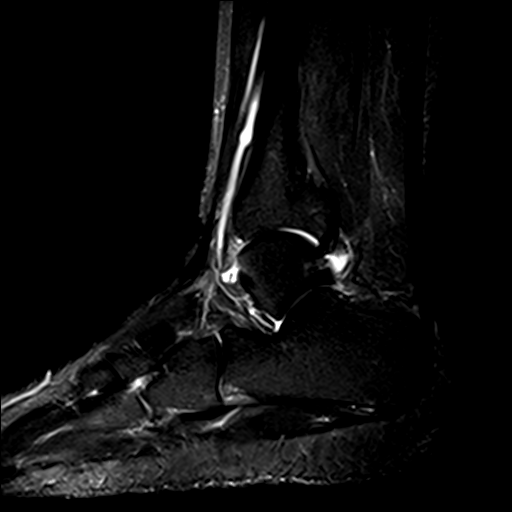
[im 12/20]
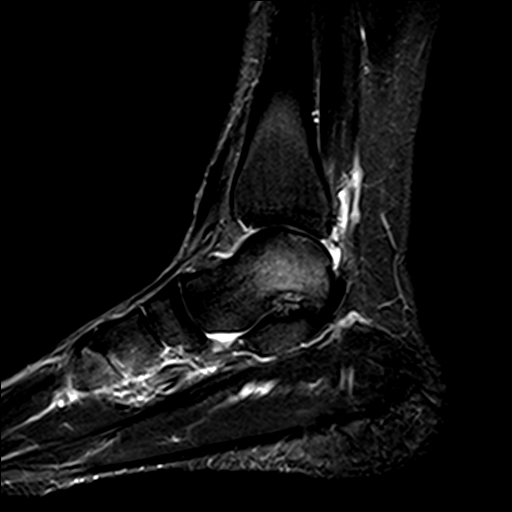
[im 16/20]
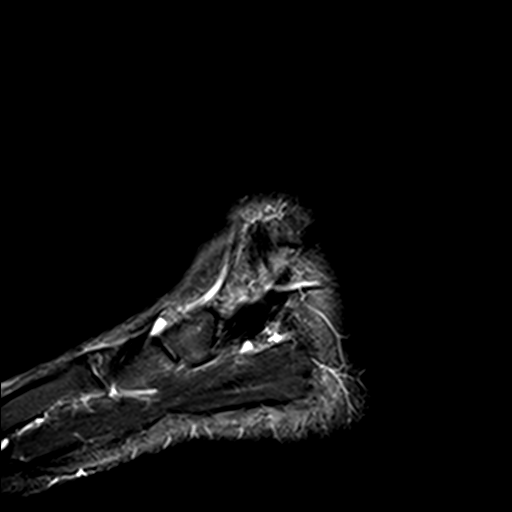

[Series 8: T2 fat-sat · coronal · 3.0mm · 0.50mm/px · 9 of 32 slices shown (2 of 2)]
[im 1/32]
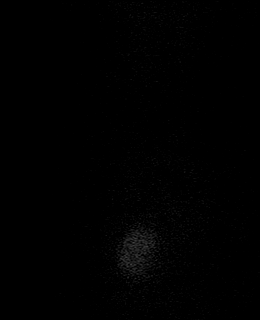
[im 4/32]
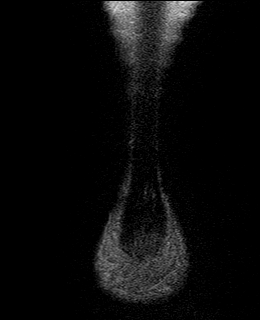
[im 8/32]
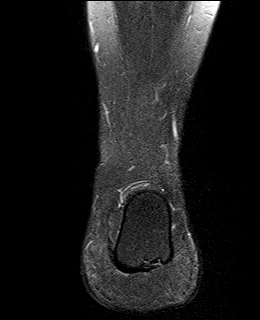
[im 12/32]
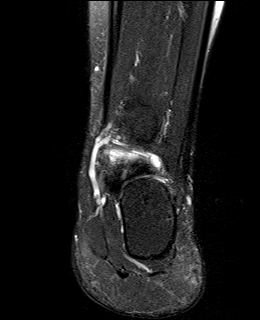
[im 16/32]
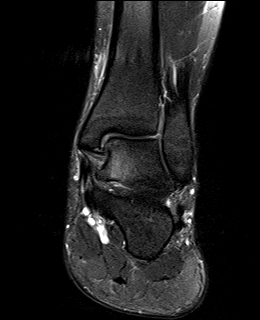
[im 20/32]
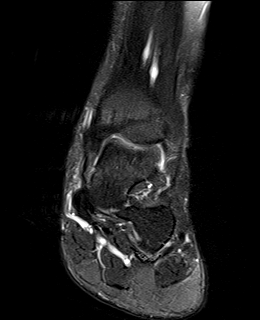
[im 24/32]
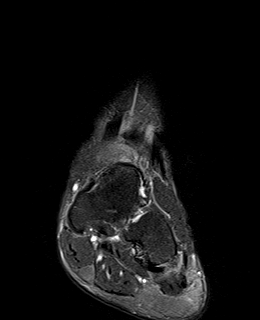
[im 28/32]
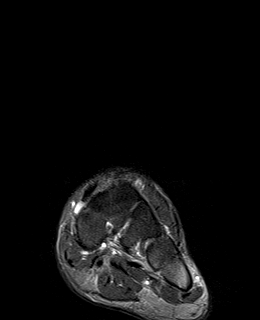
[im 32/32]
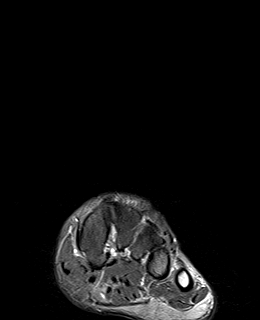

[37 of 40 positions shown; findings below may reference images not displayed]

FINDINGS: TENDONS

Peroneal: Peroneal longus tendon intact. Peroneal brevis intact.

Posteromedial: Posterior tibial tendon intact. Flexor digitorum
longus tendon intact. Flexor hallucis longus tendon intact.

Anterior: Tibialis anterior tendon intact. Extensor hallucis longus
tendon intact Extensor digitorum longus tendon intact.

Achilles:  Intact.

Plantar Fascia: Intact.

LIGAMENTS

Lateral: Anterior talofibular ligament intact. Calcaneofibular
ligament intact. Posterior talofibular ligament intact. Anterior and
posterior tibiofibular ligaments intact.

Medial: Deltoid ligament intact. Spring ligament intact.

CARTILAGE

Ankle Joint: Small joint effusion. 10 x 5 x 4 mm osteochondral
lesion of the medial talar dome with thin fluid signal undermining
the lesion (series 8, image 15; series 6, image 9). Associated
reactive marrow edema in the medial talar dome and body.

Subtalar Joints/Sinus Tarsi: Normal subtalar joints. No subtalar
joint effusion. Normal sinus tarsi.

Bones: No suspicious bone lesion.  No acute fracture or dislocation.

Soft Tissue: Medial hindfoot soft tissue swelling. No soft tissue
mass or fluid collection.
IMPRESSION: 1. 10 x 5 x 4 mm unstable osteochondral lesion of the medial talar
dome.

## 2023-02-24 DIAGNOSIS — Z872 Personal history of diseases of the skin and subcutaneous tissue: Secondary | ICD-10-CM | POA: Diagnosis not present

## 2023-02-24 DIAGNOSIS — Z760 Encounter for issue of repeat prescription: Secondary | ICD-10-CM | POA: Diagnosis not present

## 2023-03-14 DIAGNOSIS — Z419 Encounter for procedure for purposes other than remedying health state, unspecified: Secondary | ICD-10-CM | POA: Diagnosis not present

## 2023-04-14 DIAGNOSIS — Z419 Encounter for procedure for purposes other than remedying health state, unspecified: Secondary | ICD-10-CM | POA: Diagnosis not present

## 2023-05-15 DIAGNOSIS — Z419 Encounter for procedure for purposes other than remedying health state, unspecified: Secondary | ICD-10-CM | POA: Diagnosis not present

## 2023-06-14 DIAGNOSIS — Z419 Encounter for procedure for purposes other than remedying health state, unspecified: Secondary | ICD-10-CM | POA: Diagnosis not present

## 2023-07-15 DIAGNOSIS — Z419 Encounter for procedure for purposes other than remedying health state, unspecified: Secondary | ICD-10-CM | POA: Diagnosis not present

## 2023-08-14 DIAGNOSIS — Z419 Encounter for procedure for purposes other than remedying health state, unspecified: Secondary | ICD-10-CM | POA: Diagnosis not present

## 2023-09-14 DIAGNOSIS — Z419 Encounter for procedure for purposes other than remedying health state, unspecified: Secondary | ICD-10-CM | POA: Diagnosis not present

## 2023-10-15 DIAGNOSIS — Z419 Encounter for procedure for purposes other than remedying health state, unspecified: Secondary | ICD-10-CM | POA: Diagnosis not present

## 2023-11-12 DIAGNOSIS — Z419 Encounter for procedure for purposes other than remedying health state, unspecified: Secondary | ICD-10-CM | POA: Diagnosis not present

## 2023-12-24 DIAGNOSIS — Z419 Encounter for procedure for purposes other than remedying health state, unspecified: Secondary | ICD-10-CM | POA: Diagnosis not present

## 2024-01-23 DIAGNOSIS — Z419 Encounter for procedure for purposes other than remedying health state, unspecified: Secondary | ICD-10-CM | POA: Diagnosis not present

## 2024-02-23 DIAGNOSIS — Z419 Encounter for procedure for purposes other than remedying health state, unspecified: Secondary | ICD-10-CM | POA: Diagnosis not present

## 2024-03-24 DIAGNOSIS — Z419 Encounter for procedure for purposes other than remedying health state, unspecified: Secondary | ICD-10-CM | POA: Diagnosis not present

## 2024-04-24 DIAGNOSIS — Z419 Encounter for procedure for purposes other than remedying health state, unspecified: Secondary | ICD-10-CM | POA: Diagnosis not present

## 2024-05-25 DIAGNOSIS — Z419 Encounter for procedure for purposes other than remedying health state, unspecified: Secondary | ICD-10-CM | POA: Diagnosis not present

## 2024-06-17 ENCOUNTER — Ambulatory Visit
Admission: EM | Admit: 2024-06-17 | Discharge: 2024-06-17 | Disposition: A | Discharge status: Home / Self Care | Attending: Emergency Medicine | Admitting: Emergency Medicine

## 2024-06-17 DIAGNOSIS — Z8619 Personal history of other infectious and parasitic diseases: Secondary | ICD-10-CM

## 2024-06-17 DIAGNOSIS — L02811 Cutaneous abscess of head [any part, except face]: Secondary | ICD-10-CM

## 2024-06-17 MED ORDER — FLUCONAZOLE 150 MG PO TABS: 150.0000 mg | ORAL_TABLET | Freq: Once | ORAL | 0 refills | Status: AC

## 2024-06-17 MED ORDER — SULFAMETHOXAZOLE-TRIMETHOPRIM 800-160 MG PO TABS: 1.0000 | ORAL_TABLET | Freq: Two times a day (BID) | ORAL | 0 refills | Status: AC

## 2024-06-17 NOTE — ED Provider Notes (Signed)
 [facility]    CSN: [id number] Arrival date & time: 06/17/24  0947      History   Chief Complaint Chief Complaint  Patient presents with   Abscess    HPI Connie Morales is a 38 y.o. female.  Accompanied by her husband, patient presents with a painful abscess in her scalp x 2 days.  She has noticed painful swollen lymph nodes on the side of her neck below where the abscess is.  No drainage, fever, or chills.  She has taken Tylenol  for the discomfort.  The history is provided by the patient and medical records.    Past Medical History:  Diagnosis Date   Anxiety    Asthma     Patient Active Problem List   Diagnosis Date Noted   Breech presentation 04/04/2019   Anxiety 12/17/2016   Routine history and physical examination of adult 12/17/2016   Asthma 10/17/2014    Past Surgical History:  Procedure Laterality Date   CESAREAN SECTION N/A 04/04/2019   Procedure: CESAREAN SECTION;  Surgeon: Timmie Norris, MD;  Location: [facility];  Service: Obstetrics;  Laterality: N/A;   WISDOM TOOTH EXTRACTION      OB History     Gravida  2   Para  1   Term  1   Preterm  0   AB  1   Living  1      SAB  0   IAB  1   Ectopic  0   Multiple  0   Live Births  1            Home Medications    Prior to Admission medications   Medication Sig Start Date End Date Taking? Authorizing Provider  fluconazole (DIFLUCAN) 150 MG tablet Take 1 tablet (150 mg total) by mouth once for 1 dose. 06/17/24 06/17/24 Yes Corlis Burnard DEL, NP  sulfamethoxazole-trimethoprim (BACTRIM DS) 800-160 MG tablet Take 1 tablet by mouth 2 (two) times daily for 7 days. 06/17/24 06/24/24 Yes Corlis Burnard DEL, NP  albuterol  (PROVENTIL ) (2.5 MG/3ML) 0.083% nebulizer solution Take 2.5 mg by nebulization every 6 (six) hours as needed for wheezing or shortness of breath.    [provider]  famotidine  (PEPCID ) 20 MG tablet Take 20 mg by mouth 2 (two) times daily.    [provider]     Family History Family History  Problem Relation Age of Onset   Diabetes Mother    Heart failure Mother    Hypertension Father    Kidney cancer Father    Polycystic ovary syndrome Sister    Thalassemia Sister    Heart failure Maternal Grandmother    Hypertension Maternal Grandmother    Anxiety disorder Maternal Grandmother    Anemia Maternal Grandmother    Thalassemia Maternal Grandmother    Heart failure Maternal Grandfather    Healthy Paternal Grandmother     Social History Social History   Tobacco Use   Smoking status: Never   Smokeless tobacco: Never  Vaping Use   Vaping status: Former  Substance Use Topics   Alcohol use: Yes    Comment: SOCIALLY   Drug use: Never     Allergies   Fish allergy and Kiwi extract   Review of Systems Review of Systems  Constitutional:  Negative for chills and fever.  Skin:  Positive for wound. Negative for color change.     Physical Exam Triage Vital Signs ED Triage Vitals  Encounter Vitals Group  BP 06/17/24 1045 108/72     Girls Systolic BP Percentile --      Girls Diastolic BP Percentile --      Boys Systolic BP Percentile --      Boys Diastolic BP Percentile --      Pulse Rate 06/17/24 1045 75     Resp 06/17/24 1045 18     Temp 06/17/24 1045 97.9 F (36.6 C)     Temp src --      SpO2 06/17/24 1045 96 %     Weight --      Height --      Head Circumference --      Peak Flow --      Pain Score 06/17/24 1055 10     Pain Loc --      Pain Education --      Exclude from Growth Chart --    No data found.  Updated Vital Signs BP 108/72   Pulse 75   Temp 97.9 F (36.6 C)   Resp 18   LMP 05/29/2024   SpO2 96%   Visual Acuity Right Eye Distance:   Left Eye Distance:   Bilateral Distance:    Right Eye Near:   Left Eye Near:    Bilateral Near:     Physical Exam Constitutional:      General: She is not in acute distress. HENT:     Mouth/Throat:     Mouth: Mucous membranes are moist.   Cardiovascular:     Rate and Rhythm: Normal rate and regular rhythm.  Pulmonary:     Effort: Pulmonary effort is normal. No respiratory distress.  Skin:    General: Skin is warm and dry.     Findings: Lesion present.     Comments: 2 cm tender fluctuant abscess on top left side of scalp.  Shotty lymph nodes of left cervical chain.  Neurological:     Mental Status: She is alert.      UC Treatments / Results  Labs (all labs ordered are listed, but only abnormal results are displayed) Labs Reviewed - No data to display  EKG   Radiology No results found.  Procedures Incision and Drainage  Date/Time: 06/17/2024 11:41 AM  Performed by: Corlis Burnard DEL, NP Authorized by: Corlis Burnard DEL, NP   Consent:    Consent obtained:  Verbal   Consent given by:  Patient   Risks discussed:  Bleeding, incomplete drainage and pain Universal protocol:    Procedure explained and questions answered to patient or proxy's satisfaction: yes   Location:    Type:  Abscess   Location:  Head   Head location:  Scalp Pre-procedure details:    Skin preparation:  Povidone-iodine Anesthesia:    Anesthesia method:  Local infiltration   Local anesthetic:  Lidocaine  1% w/o epi Procedure type:    Complexity:  Simple Procedure details:    Incision types:  Single straight   Drainage:  Purulent and bloody   Drainage amount:  Moderate   Wound treatment:  Wound left open   Packing materials:  None Post-procedure details:    Procedure completion:  Tolerated well, no immediate complications  (including critical care time)  Medications Ordered in UC Medications - No data to display  Initial Impression / Assessment and Plan / UC Course  I have reviewed the triage vital signs and the nursing notes.  Pertinent labs & imaging results that were available during my care of the patient were reviewed by  me and considered in my medical decision making (see chart for details).    Abscess of scalp, history of  vaginal yeast infection when taking antibiotics.  Afebrile and vital signs are stable.  I&D performed today.  Treating with Bactrim and warm compresses.  Education provided on skin abscess.  1 tablet of Diflucan prescribed today as patient reports history of yeast infections when taking antibiotics.  Instructed patient to follow-up with her PCP if her symptoms are not improving.  She agrees to plan of care.  Final Clinical Impressions(s) / UC Diagnoses   Final diagnoses:  Abscess of scalp  History of candidiasis of vagina     Discharge Instructions      Take the Bactrim as directed.  See the attached information on skin abscess.  Follow-up with your primary care provider if you are not improving.     ED Prescriptions     Medication Sig Dispense Auth. Provider   sulfamethoxazole-trimethoprim (BACTRIM DS) 800-160 MG tablet Take 1 tablet by mouth 2 (two) times daily for 7 days. 14 tablet Corlis Burnard DEL, NP   fluconazole (DIFLUCAN) 150 MG tablet Take 1 tablet (150 mg total) by mouth once for 1 dose. 1 tablet Corlis Burnard DEL, NP      PDMP not reviewed this encounter.   Corlis Burnard DEL, NP 06/17/24 [phone]

## 2024-06-17 NOTE — Discharge Instructions (Addendum)
 Take the Bactrim as directed.  See the attached information on skin abscess.  Follow-up with your primary care provider if you are not improving.

## 2024-06-17 NOTE — ED Triage Notes (Signed)
 Patient to Urgent Care with complaints of an abscess to the left side of her scalp. Denies any drainage. Reports swollen lymph nodes on same side.   Symptoms x2 days.   Taking tylenol .

## 2024-07-17 DIAGNOSIS — J4521 Mild intermittent asthma with (acute) exacerbation: Secondary | ICD-10-CM | POA: Diagnosis not present
